# Patient Record
Sex: Female | Born: 1995
Health system: Southern US, Community
[De-identification: ages and names within clinical notes are randomized; demographics above are authoritative.]

## PROBLEM LIST (undated history)

## (undated) ENCOUNTER — Inpatient Hospital Stay: Payer: Self-pay

## (undated) DIAGNOSIS — R51 Headache: Secondary | ICD-10-CM

## (undated) DIAGNOSIS — J45909 Unspecified asthma, uncomplicated: Secondary | ICD-10-CM

## (undated) DIAGNOSIS — O99012 Anemia complicating pregnancy, second trimester: Secondary | ICD-10-CM

## (undated) DIAGNOSIS — R519 Headache, unspecified: Secondary | ICD-10-CM

## (undated) HISTORY — PX: NO PAST SURGERIES: SHX2092

---
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
## 2013-09-11 ENCOUNTER — Emergency Department: Payer: Self-pay | Admitting: Emergency Medicine

## 2013-12-28 ENCOUNTER — Emergency Department: Payer: Self-pay | Admitting: Emergency Medicine

## 2014-01-22 ENCOUNTER — Emergency Department: Payer: Self-pay | Admitting: Emergency Medicine

## 2014-01-22 LAB — COMPREHENSIVE METABOLIC PANEL
ALK PHOS: 79 U/L
ALT: 18 U/L (ref 12–78)
Albumin: 3.8 g/dL (ref 3.8–5.6)
Anion Gap: 8 (ref 7–16)
BUN: 8 mg/dL (ref 7–18)
Bilirubin,Total: 0.5 mg/dL (ref 0.2–1.0)
CALCIUM: 9 mg/dL (ref 9.0–10.7)
CREATININE: 0.8 mg/dL (ref 0.60–1.30)
Chloride: 108 mmol/L — ABNORMAL HIGH (ref 98–107)
Co2: 21 mmol/L (ref 21–32)
EGFR (African American): >60
EGFR (Non-African Amer.): >60
GLUCOSE: 82 mg/dL (ref 65–99)
Osmolality: 271 (ref 275–301)
Potassium: 3.4 mmol/L — ABNORMAL LOW (ref 3.5–5.1)
SGOT(AST): 26 U/L (ref 0–26)
Sodium: 137 mmol/L (ref 136–145)
Total Protein: 7.7 g/dL (ref 6.4–8.6)

## 2014-01-22 LAB — CBC WITH DIFFERENTIAL/PLATELET
Basophil #: 0.1 10*3/uL (ref 0.0–0.1)
Basophil %: 0.7 %
EOS PCT: 0.9 %
Eosinophil #: 0.1 10*3/uL (ref 0.0–0.7)
HCT: 39.5 % (ref 35.0–47.0)
HGB: 12.7 g/dL (ref 12.0–16.0)
LYMPHS PCT: 47.6 %
Lymphocyte #: 3.7 10*3/uL — ABNORMAL HIGH (ref 1.0–3.6)
MCH: 29.3 pg (ref 26.0–34.0)
MCHC: 32.1 g/dL (ref 32.0–36.0)
MCV: 91 fL (ref 80–100)
MONO ABS: 0.5 x10 3/mm (ref 0.2–0.9)
Monocyte %: 6.9 %
NEUTROS ABS: 3.4 10*3/uL (ref 1.4–6.5)
Neutrophil %: 43.9 %
Platelet: 244 10*3/uL (ref 150–440)
RBC: 4.32 10*6/uL (ref 3.80–5.20)
RDW: 14.4 % (ref 11.5–14.5)
WBC: 7.8 10*3/uL (ref 3.6–11.0)

## 2014-01-22 LAB — URINALYSIS, COMPLETE
BLOOD: NEGATIVE
Bacteria: NONE SEEN
Bilirubin,UR: NEGATIVE
Glucose,UR: NEGATIVE mg/dL (ref 0–75)
NITRITE: NEGATIVE
Ph: 5 (ref 4.5–8.0)
Protein: 30
Specific Gravity: 1.033 (ref 1.003–1.030)
WBC UR: 80 /HPF (ref 0–5)

## 2014-01-22 LAB — TROPONIN I

## 2014-01-22 LAB — LIPASE, BLOOD: LIPASE: 144 U/L (ref 73–393)

## 2014-02-03 ENCOUNTER — Emergency Department: Payer: Self-pay | Admitting: Emergency Medicine

## 2014-02-03 LAB — URINALYSIS, COMPLETE
Bilirubin,UR: NEGATIVE
Glucose,UR: NEGATIVE mg/dL (ref 0–75)
KETONE: NEGATIVE
Nitrite: NEGATIVE
PH: 7 (ref 4.5–8.0)
Protein: NEGATIVE
RBC,UR: 6 /HPF (ref 0–5)
SPECIFIC GRAVITY: 1.006 (ref 1.003–1.030)
Squamous Epithelial: 9
WBC UR: 28 /HPF (ref 0–5)

## 2014-02-04 LAB — URINE CULTURE

## 2014-03-01 ENCOUNTER — Emergency Department: Payer: Self-pay | Admitting: Emergency Medicine

## 2014-03-01 LAB — COMPREHENSIVE METABOLIC PANEL
ALBUMIN: 4 g/dL (ref 3.8–5.6)
Alkaline Phosphatase: 90 U/L
Anion Gap: 13 (ref 7–16)
BUN: 12 mg/dL (ref 7–18)
Bilirubin,Total: 0.5 mg/dL (ref 0.2–1.0)
CHLORIDE: 108 mmol/L — AB (ref 98–107)
Calcium, Total: 9.2 mg/dL (ref 9.0–10.7)
Co2: 21 mmol/L (ref 21–32)
Creatinine: 0.99 mg/dL (ref 0.60–1.30)
EGFR (Non-African Amer.): >60
GLUCOSE: 121 mg/dL — AB (ref 65–99)
Osmolality: 284 (ref 275–301)
POTASSIUM: 3.5 mmol/L (ref 3.5–5.1)
SGOT(AST): 21 U/L (ref 0–26)
SGPT (ALT): 15 U/L
SODIUM: 142 mmol/L (ref 136–145)
TOTAL PROTEIN: 8.2 g/dL (ref 6.4–8.6)

## 2014-03-01 LAB — URINALYSIS, COMPLETE
BACTERIA: NONE SEEN
Bilirubin,UR: NEGATIVE
Glucose,UR: NEGATIVE mg/dL (ref 0–75)
Leukocyte Esterase: NEGATIVE
NITRITE: NEGATIVE
Ph: 5 (ref 4.5–8.0)
SQUAMOUS EPITHELIAL: NONE SEEN
Specific Gravity: 1.034 (ref 1.003–1.030)

## 2014-03-01 LAB — CBC WITH DIFFERENTIAL/PLATELET
BASOS PCT: 0.3 %
Basophil #: 0 10*3/uL (ref 0.0–0.1)
Eosinophil #: 0 10*3/uL (ref 0.0–0.7)
Eosinophil %: 0.1 %
HCT: 42.5 % (ref 35.0–47.0)
HGB: 13.7 g/dL (ref 12.0–16.0)
LYMPHS PCT: 9.9 %
Lymphocyte #: 0.8 10*3/uL — ABNORMAL LOW (ref 1.0–3.6)
MCH: 30 pg (ref 26.0–34.0)
MCHC: 32.2 g/dL (ref 32.0–36.0)
MCV: 93 fL (ref 80–100)
MONOS PCT: 2.9 %
Monocyte #: 0.2 x10 3/mm (ref 0.2–0.9)
NEUTROS PCT: 86.8 %
Neutrophil #: 6.7 10*3/uL — ABNORMAL HIGH (ref 1.4–6.5)
Platelet: 262 10*3/uL (ref 150–440)
RBC: 4.57 10*6/uL (ref 3.80–5.20)
RDW: 14.3 % (ref 11.5–14.5)
WBC: 7.7 10*3/uL (ref 3.6–11.0)

## 2014-03-01 LAB — LIPASE, BLOOD: LIPASE: 116 U/L (ref 73–393)

## 2014-07-04 ENCOUNTER — Emergency Department: Payer: Self-pay | Admitting: Emergency Medicine

## 2014-07-04 LAB — COMPREHENSIVE METABOLIC PANEL
ALK PHOS: 88 U/L
ALT: 25 U/L
Albumin: 4 g/dL (ref 3.4–5.0)
Anion Gap: 7 (ref 7–16)
BILIRUBIN TOTAL: 0.5 mg/dL (ref 0.2–1.0)
BUN: 8 mg/dL (ref 7–18)
CHLORIDE: 108 mmol/L — AB (ref 98–107)
CREATININE: 0.81 mg/dL (ref 0.60–1.30)
Calcium, Total: 9.5 mg/dL (ref 8.5–10.1)
Co2: 25 mmol/L (ref 21–32)
EGFR (African American): >60
EGFR (Non-African Amer.): >60
GLUCOSE: 118 mg/dL — AB (ref 65–99)
Osmolality: 279 (ref 275–301)
POTASSIUM: 3.8 mmol/L (ref 3.5–5.1)
SGOT(AST): 26 U/L (ref 15–37)
Sodium: 140 mmol/L (ref 136–145)
TOTAL PROTEIN: 7.9 g/dL (ref 6.4–8.2)

## 2014-07-04 LAB — CBC WITH DIFFERENTIAL/PLATELET
BASOS ABS: 0 10*3/uL (ref 0.0–0.1)
Basophil %: 0.4 %
EOS PCT: 1.3 %
Eosinophil #: 0.1 10*3/uL (ref 0.0–0.7)
HCT: 41.3 % (ref 35.0–47.0)
HGB: 13.4 g/dL (ref 12.0–16.0)
LYMPHS PCT: 13 %
Lymphocyte #: 1.1 10*3/uL (ref 1.0–3.6)
MCH: 30.1 pg (ref 26.0–34.0)
MCHC: 32.5 g/dL (ref 32.0–36.0)
MCV: 93 fL (ref 80–100)
MONOS PCT: 2.7 %
Monocyte #: 0.2 x10 3/mm (ref 0.2–0.9)
NEUTROS ABS: 7.2 10*3/uL — AB (ref 1.4–6.5)
Neutrophil %: 82.6 %
Platelet: 275 10*3/uL (ref 150–440)
RBC: 4.45 10*6/uL (ref 3.80–5.20)
RDW: 14 % (ref 11.5–14.5)
WBC: 8.8 10*3/uL (ref 3.6–11.0)

## 2014-07-04 LAB — URINALYSIS, COMPLETE
BILIRUBIN, UR: NEGATIVE
GLUCOSE, UR: NEGATIVE mg/dL (ref 0–75)
NITRITE: NEGATIVE
Ph: 7 (ref 4.5–8.0)
Specific Gravity: 1.021 (ref 1.003–1.030)
Squamous Epithelial: 12
WBC UR: 46 /HPF (ref 0–5)

## 2014-07-04 LAB — LIPASE, BLOOD: Lipase: 109 U/L (ref 73–393)

## 2014-07-04 LAB — HCG, QUANTITATIVE, PREGNANCY

## 2014-07-07 LAB — URINE CULTURE

## 2014-08-28 ENCOUNTER — Emergency Department: Payer: Self-pay | Admitting: Emergency Medicine

## 2014-10-02 ENCOUNTER — Emergency Department: Payer: Self-pay | Admitting: Emergency Medicine

## 2014-10-02 LAB — COMPREHENSIVE METABOLIC PANEL
ALK PHOS: 71 U/L
ALT: 13 U/L — AB
AST: 19 U/L
Albumin: 4.7 g/dL
Anion Gap: 8 (ref 7–16)
BUN: 12 mg/dL
Bilirubin,Total: 0.3 mg/dL
Calcium, Total: 9.6 mg/dL
Chloride: 108 mmol/L
Co2: 24 mmol/L
Creatinine: 0.69 mg/dL
EGFR (African American): >60
Glucose: 142 mg/dL — ABNORMAL HIGH
POTASSIUM: 3.9 mmol/L
SODIUM: 140 mmol/L
Total Protein: 7.7 g/dL

## 2014-10-02 LAB — CBC WITH DIFFERENTIAL/PLATELET
BASOS ABS: 0.1 10*3/uL (ref 0.0–0.1)
Basophil %: 0.6 %
EOS PCT: 0.1 %
Eosinophil #: 0 10*3/uL (ref 0.0–0.7)
HCT: 39 % (ref 35.0–47.0)
HGB: 13 g/dL (ref 12.0–16.0)
LYMPHS ABS: 0.8 10*3/uL — AB (ref 1.0–3.6)
Lymphocyte %: 8.2 %
MCH: 30.3 pg (ref 26.0–34.0)
MCHC: 33.2 g/dL (ref 32.0–36.0)
MCV: 91 fL (ref 80–100)
Monocyte #: 0.3 x10 3/mm (ref 0.2–0.9)
Monocyte %: 2.8 %
Neutrophil #: 9 10*3/uL — ABNORMAL HIGH (ref 1.4–6.5)
Neutrophil %: 88.3 %
Platelet: 250 10*3/uL (ref 150–440)
RBC: 4.28 10*6/uL (ref 3.80–5.20)
RDW: 13.9 % (ref 11.5–14.5)
WBC: 10.1 10*3/uL (ref 3.6–11.0)

## 2014-10-02 LAB — LIPASE, BLOOD: Lipase: 49 U/L

## 2014-10-02 LAB — HCG, QUANTITATIVE, PREGNANCY: Beta Hcg, Quant.: <1 m[IU]/mL

## 2014-11-01 ENCOUNTER — Emergency Department: Admit: 2014-11-01 | Discharge status: Against Medical Advice | Payer: Self-pay | Admitting: Emergency Medicine

## 2014-11-01 LAB — COMPREHENSIVE METABOLIC PANEL
ALBUMIN: 4.6 g/dL
ALK PHOS: 67 U/L
ANION GAP: 10 (ref 7–16)
BUN: 9 mg/dL
Bilirubin,Total: 0.4 mg/dL
CALCIUM: 9.5 mg/dL
Chloride: 106 mmol/L
Co2: 23 mmol/L
Creatinine: 0.72 mg/dL
EGFR (African American): >60
EGFR (Non-African Amer.): >60
Glucose: 125 mg/dL — ABNORMAL HIGH
Potassium: 3.8 mmol/L
SGOT(AST): 23 U/L
SGPT (ALT): 15 U/L
SODIUM: 139 mmol/L
Total Protein: 7.8 g/dL

## 2014-11-01 LAB — CBC WITH DIFFERENTIAL/PLATELET
BASOS ABS: 0 10*3/uL (ref 0.0–0.1)
Basophil %: 0.3 %
Eosinophil #: 0 10*3/uL (ref 0.0–0.7)
Eosinophil %: 0 %
HCT: 39.2 % (ref 35.0–47.0)
HGB: 13.1 g/dL (ref 12.0–16.0)
Lymphocyte #: 0.5 10*3/uL — ABNORMAL LOW (ref 1.0–3.6)
Lymphocyte %: 5.6 %
MCH: 30.5 pg (ref 26.0–34.0)
MCHC: 33.3 g/dL (ref 32.0–36.0)
MCV: 92 fL (ref 80–100)
Monocyte #: 0.2 x10 3/mm (ref 0.2–0.9)
Monocyte %: 1.9 %
NEUTROS ABS: 8.9 10*3/uL — AB (ref 1.4–6.5)
NEUTROS PCT: 92.2 %
Platelet: 231 10*3/uL (ref 150–440)
RBC: 4.28 10*6/uL (ref 3.80–5.20)
RDW: 14 % (ref 11.5–14.5)
WBC: 9.7 10*3/uL (ref 3.6–11.0)

## 2014-12-01 ENCOUNTER — Other Ambulatory Visit: Payer: Self-pay

## 2014-12-01 ENCOUNTER — Emergency Department: Payer: Medicaid Other

## 2014-12-01 ENCOUNTER — Encounter: Payer: Self-pay | Admitting: Emergency Medicine

## 2014-12-01 ENCOUNTER — Emergency Department
Admission: EM | Admit: 2014-12-01 | Discharge: 2014-12-01 | Disposition: A | Discharge status: Home / Self Care | Payer: Medicaid Other | Attending: Emergency Medicine | Admitting: Emergency Medicine

## 2014-12-01 DIAGNOSIS — Z3202 Encounter for pregnancy test, result negative: Secondary | ICD-10-CM | POA: Insufficient documentation

## 2014-12-01 DIAGNOSIS — N946 Dysmenorrhea, unspecified: Secondary | ICD-10-CM | POA: Insufficient documentation

## 2014-12-01 DIAGNOSIS — R112 Nausea with vomiting, unspecified: Secondary | ICD-10-CM

## 2014-12-01 DIAGNOSIS — Z79899 Other long term (current) drug therapy: Secondary | ICD-10-CM | POA: Diagnosis not present

## 2014-12-01 DIAGNOSIS — E876 Hypokalemia: Secondary | ICD-10-CM | POA: Insufficient documentation

## 2014-12-01 DIAGNOSIS — N3000 Acute cystitis without hematuria: Secondary | ICD-10-CM | POA: Diagnosis not present

## 2014-12-01 DIAGNOSIS — K922 Gastrointestinal hemorrhage, unspecified: Secondary | ICD-10-CM | POA: Diagnosis not present

## 2014-12-01 DIAGNOSIS — R079 Chest pain, unspecified: Secondary | ICD-10-CM | POA: Diagnosis present

## 2014-12-01 DIAGNOSIS — J4521 Mild intermittent asthma with (acute) exacerbation: Secondary | ICD-10-CM | POA: Insufficient documentation

## 2014-12-01 HISTORY — DX: Unspecified asthma, uncomplicated: J45.909

## 2014-12-01 LAB — URINALYSIS COMPLETE WITH MICROSCOPIC (ARMC ONLY)
Bilirubin Urine: NEGATIVE
Glucose, UA: NEGATIVE mg/dL
NITRITE: NEGATIVE
Protein, ur: NEGATIVE mg/dL
SPECIFIC GRAVITY, URINE: 1.005 (ref 1.005–1.030)
pH: 6 (ref 5.0–8.0)

## 2014-12-01 LAB — CBC WITH DIFFERENTIAL/PLATELET
Basophils Absolute: 0 10*3/uL (ref 0–0.1)
Basophils Relative: 1 %
Eosinophils Absolute: 0 10*3/uL (ref 0–0.7)
Eosinophils Relative: 0 %
HCT: 39.4 % (ref 35.0–47.0)
HEMOGLOBIN: 13.3 g/dL (ref 12.0–16.0)
LYMPHS ABS: 2.5 10*3/uL (ref 1.0–3.6)
LYMPHS PCT: 31 %
MCH: 30.7 pg (ref 26.0–34.0)
MCHC: 33.8 g/dL (ref 32.0–36.0)
MCV: 90.6 fL (ref 80.0–100.0)
Monocytes Absolute: 0.6 10*3/uL (ref 0.2–0.9)
Monocytes Relative: 7 %
Neutro Abs: 4.9 10*3/uL (ref 1.4–6.5)
Neutrophils Relative %: 61 %
Platelets: 248 10*3/uL (ref 150–440)
RBC: 4.34 MIL/uL (ref 3.80–5.20)
RDW: 13.8 % (ref 11.5–14.5)
WBC: 8 10*3/uL (ref 3.6–11.0)

## 2014-12-01 LAB — BASIC METABOLIC PANEL
ANION GAP: 13 (ref 5–15)
BUN: 12 mg/dL (ref 6–20)
CO2: 24 mmol/L (ref 22–32)
Calcium: 9.4 mg/dL (ref 8.9–10.3)
Chloride: 99 mmol/L — ABNORMAL LOW (ref 101–111)
Creatinine, Ser: 0.86 mg/dL (ref 0.44–1.00)
GFR calc non Af Amer: >60 mL/min (ref >60)
Glucose, Bld: 80 mg/dL (ref 65–99)
Potassium: 2.8 mmol/L — CL (ref 3.5–5.1)
SODIUM: 136 mmol/L (ref 135–145)

## 2014-12-01 LAB — LIPASE, BLOOD: Lipase: 36 U/L (ref 22–51)

## 2014-12-01 LAB — MAGNESIUM: MAGNESIUM: 2.2 mg/dL (ref 1.7–2.4)

## 2014-12-01 LAB — PHOSPHORUS: Phosphorus: 2.3 mg/dL — ABNORMAL LOW (ref 2.5–4.6)

## 2014-12-01 LAB — PREGNANCY, URINE: Preg Test, Ur: NEGATIVE

## 2014-12-01 MED ORDER — POTASSIUM CHLORIDE CRYS ER 20 MEQ PO TBCR
20.0000 meq | EXTENDED_RELEASE_TABLET | Freq: Once | ORAL | Status: AC
  Administered 2014-12-01: 20 meq via ORAL

## 2014-12-01 MED ORDER — KETOROLAC TROMETHAMINE 30 MG/ML IJ SOLN
INTRAMUSCULAR | Status: AC
  Administered 2014-12-01: 30 mg via INTRAVENOUS
  Filled 2014-12-01: qty 1

## 2014-12-01 MED ORDER — POTASSIUM CHLORIDE ER 10 MEQ PO TBCR: 10.0000 meq | EXTENDED_RELEASE_TABLET | Freq: Every day | ORAL | Status: DC

## 2014-12-01 MED ORDER — ONDANSETRON HCL 4 MG/2ML IJ SOLN
INTRAMUSCULAR | Status: AC
Start: 2014-12-01 — End: 2014-12-01
  Administered 2014-12-01: 4 mg via INTRAVENOUS
  Filled 2014-12-01: qty 2

## 2014-12-01 MED ORDER — IBUPROFEN 800 MG PO TABS: 800.0000 mg | ORAL_TABLET | Freq: Three times a day (TID) | ORAL | Status: DC | PRN

## 2014-12-01 MED ORDER — ONDANSETRON HCL 4 MG/2ML IJ SOLN
4.0000 mg | Freq: Once | INTRAMUSCULAR | Status: AC
  Administered 2014-12-01: 4 mg via INTRAVENOUS

## 2014-12-01 MED ORDER — IPRATROPIUM-ALBUTEROL 0.5-2.5 (3) MG/3ML IN SOLN
RESPIRATORY_TRACT | Status: AC
  Administered 2014-12-01: 3 mL via RESPIRATORY_TRACT
  Filled 2014-12-01: qty 3

## 2014-12-01 MED ORDER — IPRATROPIUM-ALBUTEROL 0.5-2.5 (3) MG/3ML IN SOLN
3.0000 mL | Freq: Once | RESPIRATORY_TRACT | Status: AC
  Administered 2014-12-01: 3 mL via RESPIRATORY_TRACT

## 2014-12-01 MED ORDER — DIPHENHYDRAMINE HCL 50 MG/ML IJ SOLN
25.0000 mg | Freq: Once | INTRAMUSCULAR | Status: AC
  Administered 2014-12-01: 25 mg via INTRAVENOUS

## 2014-12-01 MED ORDER — POTASSIUM CHLORIDE CRYS ER 20 MEQ PO TBCR
EXTENDED_RELEASE_TABLET | ORAL | Status: AC
  Filled 2014-12-01: qty 1

## 2014-12-01 MED ORDER — MORPHINE SULFATE 2 MG/ML IJ SOLN
INTRAMUSCULAR | Status: AC
  Filled 2014-12-01: qty 1

## 2014-12-01 MED ORDER — DIPHENHYDRAMINE HCL 50 MG/ML IJ SOLN
INTRAMUSCULAR | Status: AC
  Administered 2014-12-01: 25 mg via INTRAVENOUS
  Filled 2014-12-01: qty 1

## 2014-12-01 MED ORDER — POTASSIUM CHLORIDE 10 MEQ/100ML IV SOLN
10.0000 meq | Freq: Once | INTRAVENOUS | Status: AC
Start: 2014-12-01 — End: 2014-12-01
  Administered 2014-12-01: 10 meq via INTRAVENOUS
  Filled 2014-12-01: qty 100

## 2014-12-01 MED ORDER — METOCLOPRAMIDE HCL 5 MG/ML IJ SOLN
10.0000 mg | Freq: Once | INTRAMUSCULAR | Status: AC
  Administered 2014-12-01: 10 mg via INTRAVENOUS

## 2014-12-01 MED ORDER — METOCLOPRAMIDE HCL 5 MG/ML IJ SOLN
INTRAMUSCULAR | Status: AC
  Administered 2014-12-01: 10 mg via INTRAVENOUS
  Filled 2014-12-01: qty 2

## 2014-12-01 MED ORDER — SULFAMETHOXAZOLE-TRIMETHOPRIM 800-160 MG PO TABS: 1.0000 | ORAL_TABLET | Freq: Two times a day (BID) | ORAL | Status: DC

## 2014-12-01 MED ORDER — SODIUM CHLORIDE 0.9 % IV SOLN
Freq: Once | INTRAVENOUS | Status: AC
  Administered 2014-12-01: 19:00:00 via INTRAVENOUS

## 2014-12-01 MED ORDER — DEXAMETHASONE SODIUM PHOSPHATE 10 MG/ML IJ SOLN
INTRAMUSCULAR | Status: AC
Start: 2014-12-01 — End: 2014-12-01
  Administered 2014-12-01: 10 mg via INTRAVENOUS
  Filled 2014-12-01: qty 1

## 2014-12-01 MED ORDER — TRAMADOL HCL 50 MG PO TABS: 50.0000 mg | ORAL_TABLET | Freq: Four times a day (QID) | ORAL | Status: DC | PRN

## 2014-12-01 MED ORDER — ONDANSETRON 4 MG PO TBDP: 4.0000 mg | ORAL_TABLET | Freq: Three times a day (TID) | ORAL | Status: DC | PRN

## 2014-12-01 MED ORDER — PROMETHAZINE HCL 12.5 MG PO TABS: 12.5000 mg | ORAL_TABLET | Freq: Four times a day (QID) | ORAL | Status: DC | PRN

## 2014-12-01 MED ORDER — KETOROLAC TROMETHAMINE 30 MG/ML IJ SOLN
30.0000 mg | Freq: Once | INTRAMUSCULAR | Status: AC
  Administered 2014-12-01: 30 mg via INTRAVENOUS

## 2014-12-01 MED ORDER — DEXAMETHASONE SODIUM PHOSPHATE 10 MG/ML IJ SOLN
10.0000 mg | Freq: Once | INTRAMUSCULAR | Status: AC
  Administered 2014-12-01: 10 mg via INTRAVENOUS

## 2014-12-01 MED ORDER — SODIUM CHLORIDE 0.9 % IV BOLUS (SEPSIS)
1000.0000 mL | Freq: Once | INTRAVENOUS | Status: AC
  Administered 2014-12-01: 1000 mL via INTRAVENOUS

## 2014-12-01 MED ORDER — MORPHINE SULFATE 2 MG/ML IJ SOLN
2.0000 mg | Freq: Once | INTRAMUSCULAR | Status: AC
  Administered 2014-12-01: 2 mg via INTRAVENOUS

## 2014-12-01 NOTE — ED Notes (Signed)
Patient appears calmer now and in no distress sitting in lobby.

## 2014-12-01 NOTE — ED Provider Notes (Signed)
ED ECG REPORT I, Jenney Brester W, the attending physician, personally viewed and interpreted this ECG.   Date: 12/01/2014  EKG Time: 1729  Rate: 56  Rhythm: sinus bradycardia rhythm  Axis: Normal  Intervals: Normal  ST&T Change: None   Darien Ramusavid W Klayten Jolliff, MD 12/01/14 1743

## 2014-12-01 NOTE — ED Notes (Addendum)
Patient c/o decreased appetite x2 days with nausea. Also reports chest pain and shortness of breath. Patient states does not feel like normal asthma. Patient tachypneic and appears anxious. Mother denies history of anxiety.

## 2014-12-01 NOTE — ED Provider Notes (Signed)
CSN: [id number]     Arrival date & time 12/01/14  1330 History   First MD Initiated Contact with Patient 12/01/14 1524     Chief Complaint  Patient presents with  . Chest Pain     (Consider location/radiation/quality/duration/timing/severity/associated sxs/prior Treatment) HPI  Report and worsening vomiting wheezing over the last 3 days now is having body aches and chest pain states that she is currently on her. the last few months with her. She's been getting sicker and sicker every time with nausea vomiting Zofran that she had at home did not help but she is here today for further evaluation rates her overall pain and discomfort is a 10 out of 10 she denies fevers chills notes some increase in urination denies any bowel complaints skin rash or disease and otherwise no complaints at this time Past Medical History  Diagnosis Date  . Asthma    History reviewed. No pertinent past surgical history. No family history on file. History  Substance Use Topics  . Smoking status: Never Smoker   . Smokeless tobacco: Not on file  . Alcohol Use: No   OB History    No data available     Review of Systems  Constitutional: Negative.   HENT: Negative.   Eyes: Negative.   Respiratory: Positive for wheezing.   Cardiovascular: Positive for chest pain.  Gastrointestinal: Positive for nausea and vomiting.  Endocrine: Negative.   Genitourinary: Positive for frequency.  Musculoskeletal: Positive for myalgias.  Skin: Negative.   Neurological: Negative.   Hematological: Negative.   Psychiatric/Behavioral: Negative.   All other systems reviewed and are negative.     Allergies  Review of patient's allergies indicates no known allergies.  Home Medications   Prior to Admission medications   Medication Sig Start Date End Date Taking? Authorizing Provider  albuterol (PROVENTIL HFA;VENTOLIN HFA) 108 (90 BASE) MCG/ACT inhaler Inhale 2 puffs into the lungs every 6 (six) hours as needed for  wheezing or shortness of breath.   Yes Historical Provider, MD  ibuprofen (ADVIL,MOTRIN) 800 MG tablet Take 1 tablet (800 mg total) by mouth every 8 (eight) hours as needed for fever, mild pain, moderate pain or cramping. 12/01/14   Kartel Wolbert Kristine GarbeWilliam C Ordell Prichett, PA-C  ondansetron (ZOFRAN ODT) 4 MG disintegrating tablet Take 1 tablet (4 mg total) by mouth every 8 (eight) hours as needed for nausea or vomiting (take 15 minutes before eating). 12/01/14   Elmo Shumard William C Alexandr Yaworski, PA-C  potassium chloride (K-DUR) 10 MEQ tablet Take 1 tablet (10 mEq total) by mouth daily. 12/01/14   Chasya Zenz Kristine GarbeWilliam C Prem Coykendall, PA-C  promethazine (PHENERGAN) 12.5 MG tablet Take 1 tablet (12.5 mg total) by mouth every 6 (six) hours as needed for nausea or vomiting. 12/01/14   Lorenna Lurry Rosalyn GessWilliam C Eliodoro Gullett, PA-C  sulfamethoxazole-trimethoprim (BACTRIM DS,SEPTRA DS) 800-160 MG per tablet Take 1 tablet by mouth 2 (two) times daily. 12/01/14   Moua Rasmusson Kristine GarbeWilliam C Hurley Blevins, PA-C  traMADol (ULTRAM) 50 MG tablet Take 1 tablet (50 mg total) by mouth every 6 (six) hours as needed. 12/01/14   Leocadia Idleman William C Varonica Siharath, PA-C   BP 112/66 mmHg  Pulse 60  Temp(Src) 98.2 F (36.8 C) (Oral)  Resp 18  Ht 5\' 1"  (1.549 m)  Wt 175 lb (79.379 kg)  BMI 33.08 kg/m2  SpO2 100%  LMP 11/28/2014 (Exact Date) Physical Exam Female appearing stated age well-developed well-nourished in mild distress lying uncomfortably in bed Vitals reviewed Head ears eyes nose neck and throat exam was grossly  unremarkable Cardiovascular patient had a regular rate and rhythm no murmurs rubs gallops Pulmonary patient was wheezing throughout all fields bilaterally Abdomen was soft flat mildly tender to palpation throughout all quadrants bowel sounds positive in all 4 quadrants Skin was free of rash or disease Extremities patient was moving without difficulty had no pedal edema musculoskeletal exam was otherwise unremarkable except for some tenderness with palpation across the patient's upper  chest Neuro exam nonfocal cranial nerves II through XII grossly intact Psychologically patient is acting very appropriate and pleasant ED Course  Procedures (including critical care time) Labs Review Labs Reviewed  URINALYSIS COMPLETEWITH MICROSCOPIC ([facility] ONLY) - Abnormal; Notable for the following:    Color, Urine STRAW (*)    APPearance CLEAR (*)    Ketones, ur 1+ (*)    Hgb urine dipstick 1+ (*)    Leukocytes, UA 3+ (*)    Bacteria, UA RARE (*)    Squamous Epithelial / LPF 6-30 (*)    All other components within normal limits  BASIC METABOLIC PANEL - Abnormal; Notable for the following:    Potassium 2.8 (*)    Chloride 99 (*)    All other components within normal limits  PHOSPHORUS - Abnormal; Notable for the following:    Phosphorus 2.3 (*)    All other components within normal limits  CBC WITH DIFFERENTIAL/PLATELET  PREGNANCY, URINE  LIPASE, BLOOD  MAGNESIUM    Imaging Review Dg Chest 2 View  12/01/2014   CLINICAL DATA:  Three day history of vomiting.  EXAM: CHEST  2 VIEW  COMPARISON:  01/23/2014  FINDINGS: The heart size and mediastinal contours are within normal limits. Both lungs are clear. The visualized skeletal structures are unremarkable.  IMPRESSION: Normal chest x-ray.   Electronically Signed   By: Rudie Meyer M.D.   On: 12/01/2014 15:07   EKG noted by attending physician  MDM  Patient is reporting worsening dysmenorrhea over the last 6 months every tablet. She has vomiting was not controlled with Zofran at home states that now she is having muscle aches chest pain she's wheezing and having urinary frequency labs showed her to be hypokalemic of an early urinary tract infection after a couple of nebs she is breathing much better given her IV and oral potassium her vomiting is under control she is passed by mouth trials and after fluids and medicines feels comfortable going home we'll discharge her to follow-up with her primary care provider early next week to come  up with a plan particular for the management of her dysmenorrhea she is to take all of her potassium supplements return here for any acute concerns or worsening symptoms and also taken a biotics for urinary tract infection Final diagnoses:  Hypokalemia, gastrointestinal losses  Dysmenorrhea  Non-intractable vomiting with nausea, vomiting of unspecified type  Asthma, mild intermittent, with acute exacerbation  Acute cystitis without hematuria       Jameila Keeny Rosalyn Gess, PA-C 12/01/14 2041  Sharman Cheek, MD 12/02/14 [phone]

## 2014-12-01 NOTE — ED Notes (Signed)
PA notified of Potassium 2.8

## 2014-12-01 NOTE — Discharge Instructions (Signed)

## 2014-12-08 ENCOUNTER — Emergency Department: Payer: Medicaid Other

## 2014-12-08 ENCOUNTER — Encounter: Payer: Self-pay | Admitting: Emergency Medicine

## 2014-12-08 ENCOUNTER — Other Ambulatory Visit: Payer: Self-pay

## 2014-12-08 ENCOUNTER — Emergency Department
Admission: EM | Admit: 2014-12-08 | Discharge: 2014-12-09 | Disposition: A | Discharge status: Home / Self Care | Payer: Medicaid Other | Attending: Emergency Medicine | Admitting: Emergency Medicine

## 2014-12-08 DIAGNOSIS — Z792 Long term (current) use of antibiotics: Secondary | ICD-10-CM | POA: Diagnosis not present

## 2014-12-08 DIAGNOSIS — Z79899 Other long term (current) drug therapy: Secondary | ICD-10-CM | POA: Insufficient documentation

## 2014-12-08 DIAGNOSIS — R064 Hyperventilation: Secondary | ICD-10-CM | POA: Diagnosis not present

## 2014-12-08 DIAGNOSIS — R079 Chest pain, unspecified: Secondary | ICD-10-CM

## 2014-12-08 DIAGNOSIS — J45901 Unspecified asthma with (acute) exacerbation: Secondary | ICD-10-CM | POA: Insufficient documentation

## 2014-12-08 DIAGNOSIS — R200 Anesthesia of skin: Secondary | ICD-10-CM | POA: Insufficient documentation

## 2014-12-08 DIAGNOSIS — Z720 Tobacco use: Secondary | ICD-10-CM | POA: Insufficient documentation

## 2014-12-08 DIAGNOSIS — J45909 Unspecified asthma, uncomplicated: Secondary | ICD-10-CM

## 2014-12-08 LAB — CBC
HEMATOCRIT: 38.5 % (ref 35.0–47.0)
HEMOGLOBIN: 12.7 g/dL (ref 12.0–16.0)
MCH: 30.6 pg (ref 26.0–34.0)
MCHC: 33 g/dL (ref 32.0–36.0)
MCV: 92.9 fL (ref 80.0–100.0)
Platelets: 257 10*3/uL (ref 150–440)
RBC: 4.14 MIL/uL (ref 3.80–5.20)
RDW: 14.3 % (ref 11.5–14.5)
WBC: 8 10*3/uL (ref 3.6–11.0)

## 2014-12-08 LAB — BASIC METABOLIC PANEL
Anion gap: 8 (ref 5–15)
BUN: 6 mg/dL (ref 6–20)
CALCIUM: 9.3 mg/dL (ref 8.9–10.3)
CHLORIDE: 108 mmol/L (ref 101–111)
CO2: 24 mmol/L (ref 22–32)
CREATININE: 0.79 mg/dL (ref 0.44–1.00)
GFR calc non Af Amer: >60 mL/min (ref >60)
GLUCOSE: 99 mg/dL (ref 65–99)
Potassium: 3.5 mmol/L (ref 3.5–5.1)
Sodium: 140 mmol/L (ref 135–145)

## 2014-12-08 LAB — TROPONIN I: Troponin I: <0.03 ng/mL (ref <0.031)

## 2014-12-08 NOTE — ED Notes (Signed)
20G IV in left AC removed at d/c that was placed by EMS PTA. Catheter intact, no redness, swelling or bleeding noted.

## 2014-12-08 NOTE — ED Notes (Signed)
Pt states pain is a 10/10, but is calmly talking on her cell phone and avoiding direct eye contact during the entire initial assessment.

## 2014-12-08 NOTE — ED Notes (Signed)
Brown, MD at bedside. 

## 2014-12-08 NOTE — Discharge Instructions (Signed)
Hyperventilation °Hyperventilation is breathing that is deeper and more rapid than normal. It is usually associated with panic and anxiety. Hyperventilation can make you feel breathless. It is sometimes called overbreathing. Breathing out too much causes a decrease in the amount of carbon dioxide gas in the blood. This leads to tingling and numbness in the hands, feet, and around the mouth. If this continues, your fingers, hands, and toes may begin to spasm. Hyperventilation usually lasts 20-30 minutes and can be associated with other symptoms of panic and anxiety, including:  °· Chest pains or tightness. °· A pounding or irregular, racing heartbeat (palpitations). °· Dizziness. °· Lightheadedness. °· Dry mouth. °· Weakness. °· Confusion. °· Sleep disturbance. °CAUSES  °Sudden onset (acute) hyperventilation is usually triggered by acute stress, anxiety, or emotional upset. Long-term (chronic) and recurring hyperventilation can occur with chronic lung problems, such emphysema or asthma. Other causes include:  °· Nervousness. °· Stress. °· Stimulant, drug, or alcohol use. °· Lung disease. °· Infections, such as pneumonia. °· Heart problems. °· Severe pain. °· Waking from a bad dream. °· Pregnancy. °· Bleeding. °HOME CARE INSTRUCTIONS °· Learn and use breathing exercises that help you breathe from your diaphragm and abdomen. °· Practice relaxation techniques to reduce stress, such as visualization, meditation, and muscle release. °· During an attack, try breathing into a paper bag. This changes the carbon dioxide level and slows down breathing. °SEEK IMMEDIATE MEDICAL CARE IF: °· Your hyperventilation continues or gets worse. °MAKE SURE YOU: °· Understand these instructions. °· Will watch your condition. °· Will get help right away if you are not doing well or get worse. °Document Released: 06/20/2000 Document Revised: 12/23/2011 Document Reviewed: 10/02/2011 °[employer]® Patient Information ©2015 [employer]. This  information is not intended to replace advice given to you by your health care provider. Make sure you discuss any questions you have with your health care provider. ° °

## 2014-12-08 NOTE — ED Provider Notes (Signed)
[facility] Emergency Department Provider Note  ____________________________________________  Time seen: 11:30 PM  I have reviewed the triage vital signs and the nursing notes.   HISTORY  Chief Complaint Asthma and Chest Pain      HPI Melinda Barnett is a 19 y.o. female presents with "asthma attack and chest pain". Patient states she was outside and started feeling numbness over her entire body which began in her hands and feet and then generalized everywhere followed by lightheadedness and tingling in the hands and feet. At present patient has no complaints all symptoms have completely resolved.     Past Medical History  Diagnosis Date  . Asthma     There are no active problems to display for this patient.   History reviewed. No pertinent past surgical history.  Current Outpatient Rx  Name  Route  Sig  Dispense  Refill  . albuterol (PROVENTIL HFA;VENTOLIN HFA) 108 (90 BASE) MCG/ACT inhaler   Inhalation   Inhale 2 puffs into the lungs every 6 (six) hours as needed for wheezing or shortness of breath.         Marland Kitchen ibuprofen (ADVIL,MOTRIN) 800 MG tablet   Oral   Take 1 tablet (800 mg total) by mouth every 8 (eight) hours as needed for fever, mild pain, moderate pain or cramping.   15 tablet   0   . ondansetron (ZOFRAN ODT) 4 MG disintegrating tablet   Oral   Take 1 tablet (4 mg total) by mouth every 8 (eight) hours as needed for nausea or vomiting (take 15 minutes before eating).   20 tablet   0   . potassium chloride (K-DUR) 10 MEQ tablet   Oral   Take 1 tablet (10 mEq total) by mouth daily.   5 tablet   0   . promethazine (PHENERGAN) 12.5 MG tablet   Oral   Take 1 tablet (12.5 mg total) by mouth every 6 (six) hours as needed for nausea or vomiting.   15 tablet   0   . sulfamethoxazole-trimethoprim (BACTRIM DS,SEPTRA DS) 800-160 MG per tablet   Oral   Take 1 tablet by mouth 2 (two) times daily.   10 tablet   0   . traMADol  (ULTRAM) 50 MG tablet   Oral   Take 1 tablet (50 mg total) by mouth every 6 (six) hours as needed.   12 tablet   0     Allergies Review of patient's allergies indicates no known allergies.  No family history on file.  Social History History  Substance Use Topics  . Smoking status: Current Every Day Smoker  . Smokeless tobacco: Not on file  . Alcohol Use: No    Review of Systems  Constitutional: Negative for fever. Eyes: Negative for visual changes. ENT: Negative for sore throat. Cardiovascular: Negative for chest pain. Respiratory: Positive for shortness of breath. Gastrointestinal: Negative for abdominal pain, vomiting and diarrhea. Genitourinary: Negative for dysuria. Musculoskeletal: Negative for back pain. Skin: Negative for rash. Neurological: Negative for headaches, focal weakness or numbness.   10-point ROS otherwise negative.  ____________________________________________   PHYSICAL EXAM:  VITAL SIGNS: ED Triage Vitals  Enc Vitals Group     BP 12/08/14 2030 99/52 mmHg     Pulse Rate 12/08/14 2254 71     Resp 12/08/14 2030 16     Temp 12/08/14 2030 98.6 F (37 C)     Temp Source 12/08/14 2030 Oral     SpO2 12/08/14 2030 99 %  Weight 12/08/14 2030 175 lb (79.379 kg)     Height 12/08/14 2030 5\' 1"  (1.549 m)     Head Cir --      Peak Flow --      Pain Score 12/08/14 2031 10     Pain Loc --      Pain Edu? --      Excl. in GC? --     Constitutional: Alert and oriented. Well appearing and in no distress. Eyes: Conjunctivae are normal. PERRL. Normal extraocular movements. ENT   Head: Normocephalic and atraumatic.   Nose: No congestion/rhinnorhea.   Mouth/Throat: Mucous membranes are moist.   Neck: No stridor. Hematological/Lymphatic/Immunilogical: No cervical lymphadenopathy. Cardiovascular: Normal rate, regular rhythm. Normal and symmetric distal pulses are present in all extremities. No murmurs, rubs, or gallops. Respiratory:  Normal respiratory effort without tachypnea nor retractions. Breath sounds are clear and equal bilaterally. No wheezes/rales/rhonchi. Gastrointestinal: Soft and nontender. No distention. There is no CVA tenderness. Genitourinary: deferred Musculoskeletal: Nontender with normal range of motion in all extremities. No joint effusions.  No lower extremity tenderness nor edema. Neurologic:  Normal speech and language. No gross focal neurologic deficits are appreciated. Speech is normal.  Skin:  Skin is warm, dry and intact. No rash noted. Psychiatric: Mood and affect are normal. Speech and behavior are normal. Patient exhibits appropriate insight and judgment.  ____________________________________________    LABS (pertinent positives/negatives)  Labs Reviewed  CBC  BASIC METABOLIC PANEL  TROPONIN I     ____________________________________________   EKG interpreted by me   Date: 12/08/2014  Rate:78  Rhythm: normal sinus rhythm  QRS Axis: normal  Intervals: normal  ST/T Wave abnormalities: normal  Conduction Disutrbances: none  Narrative Interpretation: unremarkable      ____________________________________________    RADIOLOGY  Chest x-ray revealed no acute process  ____________________________________________     INITIAL IMPRESSION / ASSESSMENT AND PLAN / ED COURSE  Pertinent labs & imaging results that were available during my care of the patient were reviewed by me and considered in my medical decision making (see chart for details).  History of physical exam and lab data consistent with possible hyperventilation. Family at the bedside stated that during the time of the event patient was breathing rapidly and appeared to not be to catch her breath.  ____________________________________________   FINAL CLINICAL IMPRESSION(S) / ED DIAGNOSES  Final diagnoses:  Chest pain  Asthma      Darci Currentandolph N Meghin Thivierge, MD 12/08/14 2353

## 2014-12-08 NOTE — ED Notes (Signed)
Pt presents to ED with c/o asthma attack and chest pain. Pt reports that she was outside of her home when she began to feel short of breath and lightheaded. Pt reports that symptoms have resolved now except for some residual chest tightness. Pt reports history of asthma and reports that this episode felt like an asthma attack. Pt is awake and alert, sitting upright in triage with no acute distress noted.

## 2015-01-25 ENCOUNTER — Emergency Department
Admission: EM | Admit: 2015-01-25 | Discharge: 2015-01-25 | Disposition: A | Discharge status: Home / Self Care | Payer: Medicaid Other | Attending: Emergency Medicine | Admitting: Emergency Medicine

## 2015-01-25 ENCOUNTER — Encounter: Payer: Self-pay | Admitting: Emergency Medicine

## 2015-01-25 DIAGNOSIS — O210 Mild hyperemesis gravidarum: Secondary | ICD-10-CM | POA: Diagnosis present

## 2015-01-25 DIAGNOSIS — F17210 Nicotine dependence, cigarettes, uncomplicated: Secondary | ICD-10-CM | POA: Insufficient documentation

## 2015-01-25 DIAGNOSIS — O99331 Smoking (tobacco) complicating pregnancy, first trimester: Secondary | ICD-10-CM | POA: Insufficient documentation

## 2015-01-25 DIAGNOSIS — Z3A01 Less than 8 weeks gestation of pregnancy: Secondary | ICD-10-CM | POA: Diagnosis not present

## 2015-01-25 DIAGNOSIS — O2311 Infections of bladder in pregnancy, first trimester: Secondary | ICD-10-CM | POA: Diagnosis not present

## 2015-01-25 DIAGNOSIS — Z79899 Other long term (current) drug therapy: Secondary | ICD-10-CM | POA: Diagnosis not present

## 2015-01-25 LAB — COMPREHENSIVE METABOLIC PANEL
ALT: 10 U/L — AB (ref 14–54)
ANION GAP: 9 (ref 5–15)
AST: 15 U/L (ref 15–41)
Albumin: 4.1 g/dL (ref 3.5–5.0)
Alkaline Phosphatase: 48 U/L (ref 38–126)
BILIRUBIN TOTAL: 0.3 mg/dL (ref 0.3–1.2)
BUN: 5 mg/dL — ABNORMAL LOW (ref 6–20)
CALCIUM: 9.5 mg/dL (ref 8.9–10.3)
CO2: 22 mmol/L (ref 22–32)
Chloride: 106 mmol/L (ref 101–111)
Creatinine, Ser: 0.62 mg/dL (ref 0.44–1.00)
GFR calc Af Amer: >60 mL/min (ref >60)
GFR calc non Af Amer: >60 mL/min (ref >60)
Glucose, Bld: 89 mg/dL (ref 65–99)
POTASSIUM: 3.7 mmol/L (ref 3.5–5.1)
SODIUM: 137 mmol/L (ref 135–145)
Total Protein: 7 g/dL (ref 6.5–8.1)

## 2015-01-25 LAB — CBC
HCT: 38.5 % (ref 35.0–47.0)
Hemoglobin: 12.8 g/dL (ref 12.0–16.0)
MCH: 30.3 pg (ref 26.0–34.0)
MCHC: 33.3 g/dL (ref 32.0–36.0)
MCV: 90.8 fL (ref 80.0–100.0)
PLATELETS: 240 10*3/uL (ref 150–440)
RBC: 4.24 MIL/uL (ref 3.80–5.20)
RDW: 13.7 % (ref 11.5–14.5)
WBC: 4.5 10*3/uL (ref 3.6–11.0)

## 2015-01-25 LAB — URINALYSIS COMPLETE WITH MICROSCOPIC (ARMC ONLY)
BILIRUBIN URINE: NEGATIVE
GLUCOSE, UA: NEGATIVE mg/dL
Hgb urine dipstick: NEGATIVE
Ketones, ur: NEGATIVE mg/dL
Nitrite: NEGATIVE
Protein, ur: 30 mg/dL — AB
SPECIFIC GRAVITY, URINE: 1.025 (ref 1.005–1.030)
pH: 5 (ref 5.0–8.0)

## 2015-01-25 LAB — HCG, QUANTITATIVE, PREGNANCY: hCG, Beta Chain, Quant, S: 243816 m[IU]/mL — ABNORMAL HIGH (ref <5)

## 2015-01-25 LAB — LIPASE, BLOOD: Lipase: 29 U/L (ref 22–51)

## 2015-01-25 MED ORDER — NITROFURANTOIN MACROCRYSTAL 100 MG PO CAPS: 100.0000 mg | ORAL_CAPSULE | Freq: Two times a day (BID) | ORAL | Status: DC

## 2015-01-25 MED ORDER — ONDANSETRON 8 MG PO TBDP
ORAL_TABLET | ORAL | Status: AC
  Administered 2015-01-25: 8 mg via ORAL
  Filled 2015-01-25: qty 1

## 2015-01-25 MED ORDER — ONDANSETRON 4 MG PO TBDP
8.0000 mg | ORAL_TABLET | Freq: Once | ORAL | Status: AC
  Administered 2015-01-25: 8 mg via ORAL

## 2015-01-25 MED ORDER — NITROFURANTOIN MACROCRYSTAL 100 MG PO CAPS
ORAL_CAPSULE | ORAL | Status: AC
  Filled 2015-01-25: qty 1

## 2015-01-25 MED ORDER — NITROFURANTOIN MONOHYD MACRO 100 MG PO CAPS
100.0000 mg | ORAL_CAPSULE | Freq: Once | ORAL | Status: AC
  Administered 2015-01-25: 100 mg via ORAL

## 2015-01-25 NOTE — ED Notes (Signed)
Patient presents to the ED with pelvic pain and vomiting that started this morning at 9am when she woke up.  Patient denies vaginal bleeding.  Patient states she is 7-[redacted] weeks pregnant and has had initial pregnancy confirmation at the [facility] department but has not yet seen her Ob-gyn.  Patient is in no obvious distress at this time.

## 2015-01-25 NOTE — ED Provider Notes (Signed)
[facility] Emergency Department Provider Note  ____________________________________________  Time seen: 2:10 PM  I have reviewed the triage vital signs and the nursing notes.   HISTORY  Chief Complaint Pelvic Pain and Emesis During Pregnancy    HPI Melinda Barnett is a 19 y.o. female who complains of bilateral lower abdominal pain with some nausea and vomiting today. She denies any vaginal bleeding contractions leakage of fluid. No fevers chills chest pain shortness of breath or dizziness or syncope. Denies dysuria frequency or urgency. No hematuria.     Past Medical History  Diagnosis Date  . Asthma     There are no active problems to display for this patient.   History reviewed. No pertinent past surgical history.  Current Outpatient Rx  Name  Route  Sig  Dispense  Refill  . albuterol (PROVENTIL HFA;VENTOLIN HFA) 108 (90 BASE) MCG/ACT inhaler   Inhalation   Inhale 2 puffs into the lungs every 6 (six) hours as needed for wheezing or shortness of breath.         Marland Kitchen ibuprofen (ADVIL,MOTRIN) 800 MG tablet   Oral   Take 1 tablet (800 mg total) by mouth every 8 (eight) hours as needed for fever, mild pain, moderate pain or cramping.   15 tablet   0   . nitrofurantoin (MACRODANTIN) 100 MG capsule   Oral   Take 1 capsule (100 mg total) by mouth 2 (two) times daily.   14 capsule   0   . ondansetron (ZOFRAN ODT) 4 MG disintegrating tablet   Oral   Take 1 tablet (4 mg total) by mouth every 8 (eight) hours as needed for nausea or vomiting (take 15 minutes before eating).   20 tablet   0   . potassium chloride (K-DUR) 10 MEQ tablet   Oral   Take 1 tablet (10 mEq total) by mouth daily.   5 tablet   0   . promethazine (PHENERGAN) 12.5 MG tablet   Oral   Take 1 tablet (12.5 mg total) by mouth every 6 (six) hours as needed for nausea or vomiting.   15 tablet   0   . sulfamethoxazole-trimethoprim (BACTRIM DS,SEPTRA DS) 800-160 MG per  tablet   Oral   Take 1 tablet by mouth 2 (two) times daily.   10 tablet   0   . traMADol (ULTRAM) 50 MG tablet   Oral   Take 1 tablet (50 mg total) by mouth every 6 (six) hours as needed.   12 tablet   0     Allergies Review of patient's allergies indicates no known allergies.  No family history on file.  Social History History  Substance Use Topics  . Smoking status: Current Every Day Smoker  . Smokeless tobacco: Not on file  . Alcohol Use: No    Review of Systems  Constitutional: No fever or chills. No weight changes Eyes:No blurry vision or double vision.  ENT: No sore throat. Cardiovascular: No chest pain. Respiratory: No dyspnea or cough. Gastrointestinal: As above.  No BRBPR or melena. Genitourinary: Negative for dysuria, urinary retention, bloody urine, or difficulty urinating. Musculoskeletal: Negative for back pain. No joint swelling or pain. Skin: Negative for rash. Neurological: Negative for headaches, focal weakness or numbness. Psychiatric:No anxiety or depression.   Endocrine:No hot/cold intolerance, changes in energy, or sleep difficulty.  10-point ROS otherwise negative.  ____________________________________________   PHYSICAL EXAM:  VITAL SIGNS: ED Triage Vitals  Enc Vitals Group     BP  01/25/15 1155 105/70 mmHg     Pulse Rate 01/25/15 1155 54     Resp 01/25/15 1155 20     Temp 01/25/15 1155 98.5 F (36.9 C)     Temp src --      SpO2 01/25/15 1155 99 %     Weight 01/25/15 1155 155 lb (70.308 kg)     Height 01/25/15 1155 5\' 1"  (1.549 m)     Head Cir --      Peak Flow --      Pain Score 01/25/15 1155 9     Pain Loc --      Pain Edu? --      Excl. in GC? --      Constitutional: Alert and oriented. Well appearing and in no distress. Eyes: No scleral icterus. No conjunctival pallor. PERRL. EOMI ENT   Head: Normocephalic and atraumatic.   Nose: No congestion/rhinnorhea. No septal hematoma   Mouth/Throat: MMM, no  pharyngeal erythema. No peritonsillar mass. No uvula shift.   Neck: No stridor. No SubQ emphysema. No meningismus. Hematological/Lymphatic/Immunilogical: No cervical lymphadenopathy. Cardiovascular: RRR. Normal and symmetric distal pulses are present in all extremities. No murmurs, rubs, or gallops. Respiratory: Normal respiratory effort without tachypnea nor retractions. Breath sounds are clear and equal bilaterally. No wheezes/rales/rhonchi. Gastrointestinal: Suprapubic tenderness.. No distention. There is no CVA tenderness.  No rebound, rigidity, or guarding. Genitourinary: deferred Musculoskeletal: Nontender with normal range of motion in all extremities. No joint effusions.  No lower extremity tenderness.  No edema. Neurologic:   Normal speech and language.  CN 2-10 normal. Motor grossly intact. No pronator drift.  Normal gait. No gross focal neurologic deficits are appreciated.  Skin:  Skin is warm, dry and intact. No rash noted.  No petechiae, purpura, or bullae. Psychiatric: Mood and affect are normal. Speech and behavior are normal. Patient exhibits appropriate insight and judgment.  ____________________________________________    LABS (pertinent positives/negatives) (all labs ordered are listed, but only abnormal results are displayed) Labs Reviewed  COMPREHENSIVE METABOLIC PANEL - Abnormal; Notable for the following:    BUN 5 (*)    ALT 10 (*)    All other components within normal limits  URINALYSIS COMPLETEWITH MICROSCOPIC ([facility] ONLY) - Abnormal; Notable for the following:    Color, Urine YELLOW (*)    APPearance CLOUDY (*)    Protein, ur 30 (*)    Leukocytes, UA 3+ (*)    Bacteria, UA FEW (*)    Squamous Epithelial / LPF TOO NUMEROUS TO COUNT (*)    All other components within normal limits  URINE CULTURE  LIPASE, BLOOD  CBC  HCG, QUANTITATIVE, PREGNANCY   urine white blood cell count too numerous to  count ____________________________________________   EKG    ____________________________________________    RADIOLOGY    ____________________________________________   PROCEDURES  ____________________________________________   INITIAL IMPRESSION / ASSESSMENT AND PLAN / ED COURSE  Pertinent labs & imaging results that were available during my care of the patient were reviewed by me and considered in my medical decision making (see chart for details).  Patient presents with cystitis and first trimester pregnancy. No evidence of pyelonephritis on labs vitals or clinical exam. We'll give the patient Zofran and do a trial of oral tolerance.  ----------------------------------------- 3:24 PM on 01/25/2015 -----------------------------------------  Tolerating oral intake, nausea resolved, feeling better. We'll continue on Macrobid. Follow-up urine culture sent.  ____________________________________________   FINAL CLINICAL IMPRESSION(S) / ED DIAGNOSES  Final diagnoses:  Cystitis during pregnancy in first  trimester, antepartum      Sharman Cheek, MD 01/25/15 1524

## 2015-01-25 NOTE — Discharge Instructions (Signed)
Pregnancy and Urinary Tract Infection °A urinary tract infection (UTI) is a bacterial infection of the urinary tract. Infection of the urinary tract can include the ureters, kidneys (pyelonephritis), bladder (cystitis), and urethra (urethritis). All pregnant women should be screened for bacteria in the urinary tract. Identifying and treating a UTI will decrease the risk of preterm labor and developing more serious infections in both the mother and baby. °CAUSES °Bacteria germs cause almost all UTIs.  °RISK FACTORS °Many factors can increase your chances of getting a UTI during pregnancy. These include: °· Having a short urethra. °· Poor toilet and hygiene habits. °· Sexual intercourse. °· Blockage of urine along the urinary tract. °· Problems with the pelvic muscles or nerves. °· Diabetes. °· Obesity. °· Bladder problems after having several children. °· Previous history of UTI. °SIGNS AND SYMPTOMS  °· Pain, burning, or a stinging feeling when urinating. °· Suddenly feeling the need to urinate right away (urgency). °· Loss of bladder control (urinary incontinence). °· Frequent urination, more than is common with pregnancy. °· Lower abdominal or back discomfort. °· Cloudy urine. °· Blood in the urine (hematuria). °· Fever.  °When the kidneys are infected, the symptoms may be: °· Back pain. °· Flank pain on the right side more so than the left. °· Fever. °· Chills. °· Nausea. °· Vomiting. °DIAGNOSIS  °A urinary tract infection is usually diagnosed through urine tests. Additional tests and procedures are sometimes done. These may include: °· Ultrasound exam of the kidneys, ureters, bladder, and urethra. °· Looking in the bladder with a lighted tube (cystoscopy). °TREATMENT °Typically, UTIs can be treated with antibiotic medicines.  °HOME CARE INSTRUCTIONS  °· Only take over-the-counter or prescription medicines as directed by your health care provider. If you were prescribed antibiotics, take them as directed. Finish  them even if you start to feel better. °· Drink enough fluids to keep your urine clear or pale yellow. °· Do not have sexual intercourse until the infection is gone and your health care provider says it is okay. °· Make sure you are tested for UTIs throughout your pregnancy. These infections often come back.  °Preventing a UTI in the Future °· Practice good toilet habits. Always wipe from front to back. Use the tissue only once. °· Do not hold your urine. Empty your bladder as soon as possible when the urge comes. °· Do not douche or use deodorant sprays. °· Wash with soap and warm water around the genital area and the anus. °· Empty your bladder before and after sexual intercourse. °· Wear underwear with a cotton crotch. °· Avoid caffeine and carbonated drinks. They can irritate the bladder. °· Drink cranberry juice or take cranberry pills. This may decrease the risk of getting a UTI. °· Do not drink alcohol. °· Keep all your appointments and tests as scheduled.  °SEEK MEDICAL CARE IF:  °· Your symptoms get worse. °· You are still having fevers 2 or more days after treatment begins. °· You have a rash. °· You feel that you are having problems with medicines prescribed. °· You have abnormal vaginal discharge. °SEEK IMMEDIATE MEDICAL CARE IF:  °· You have back or flank pain. °· You have chills. °· You have blood in your urine. °· You have nausea and vomiting. °· You have contractions of your uterus. °· You have a gush of fluid from the vagina. °MAKE SURE YOU: °· Understand these instructions.   °· Will watch your condition.   °· Will get help right away if you are not doing   well or get worse.   °Document Released: 10/18/2010 Document Revised: 04/13/2013 Document Reviewed: 01/20/2013 °ExitCare® Patient Information ©2015 [employer]. This information is not intended to replace advice given to you by your health care provider. Make sure you discuss any questions you have with your health care provider. ° °

## 2015-01-27 LAB — URINE CULTURE: Special Requests: NORMAL

## 2015-01-29 LAB — OB RESULTS CONSOLE HIV ANTIBODY (ROUTINE TESTING): HIV: NONREACTIVE

## 2015-01-29 LAB — OB RESULTS CONSOLE ABO/RH: RH Type: POSITIVE

## 2015-01-29 LAB — OB RESULTS CONSOLE RPR: RPR: NONREACTIVE

## 2015-01-29 LAB — OB RESULTS CONSOLE ANTIBODY SCREEN: ANTIBODY SCREEN: NEGATIVE

## 2015-01-29 LAB — OB RESULTS CONSOLE HEPATITIS B SURFACE ANTIGEN: Hepatitis B Surface Ag: NEGATIVE

## 2015-01-29 LAB — OB RESULTS CONSOLE RUBELLA ANTIBODY, IGM: RUBELLA: NON-IMMUNE/NOT IMMUNE

## 2015-01-29 LAB — OB RESULTS CONSOLE VARICELLA ZOSTER ANTIBODY, IGG: Varicella: NON-IMMUNE/NOT IMMUNE

## 2015-01-31 LAB — OB RESULTS CONSOLE GC/CHLAMYDIA
Chlamydia: NEGATIVE
GC PROBE AMP, GENITAL: NEGATIVE

## 2015-04-12 ENCOUNTER — Encounter: Payer: Self-pay | Admitting: *Deleted

## 2015-04-12 ENCOUNTER — Emergency Department
Admission: EM | Admit: 2015-04-12 | Discharge: 2015-04-12 | Disposition: A | Discharge status: Home / Self Care | Payer: Medicaid Other | Attending: Emergency Medicine | Admitting: Emergency Medicine

## 2015-04-12 DIAGNOSIS — Z79899 Other long term (current) drug therapy: Secondary | ICD-10-CM | POA: Diagnosis not present

## 2015-04-12 DIAGNOSIS — Z87891 Personal history of nicotine dependence: Secondary | ICD-10-CM | POA: Insufficient documentation

## 2015-04-12 DIAGNOSIS — Z792 Long term (current) use of antibiotics: Secondary | ICD-10-CM | POA: Insufficient documentation

## 2015-04-12 DIAGNOSIS — N390 Urinary tract infection, site not specified: Secondary | ICD-10-CM | POA: Diagnosis not present

## 2015-04-12 DIAGNOSIS — R1030 Lower abdominal pain, unspecified: Secondary | ICD-10-CM | POA: Diagnosis present

## 2015-04-12 LAB — URINALYSIS COMPLETE WITH MICROSCOPIC (ARMC ONLY)
Bilirubin Urine: NEGATIVE
GLUCOSE, UA: NEGATIVE mg/dL
Hgb urine dipstick: NEGATIVE
Ketones, ur: NEGATIVE mg/dL
NITRITE: NEGATIVE
PROTEIN: NEGATIVE mg/dL
SPECIFIC GRAVITY, URINE: 1.006 (ref 1.005–1.030)
pH: 6 (ref 5.0–8.0)

## 2015-04-12 LAB — CBC
HCT: 35.4 % (ref 35.0–47.0)
HEMOGLOBIN: 12.2 g/dL (ref 12.0–16.0)
MCH: 31.4 pg (ref 26.0–34.0)
MCHC: 34.5 g/dL (ref 32.0–36.0)
MCV: 91.1 fL (ref 80.0–100.0)
Platelets: 240 10*3/uL (ref 150–440)
RBC: 3.89 MIL/uL (ref 3.80–5.20)
RDW: 13.9 % (ref 11.5–14.5)
WBC: 7.4 10*3/uL (ref 3.6–11.0)

## 2015-04-12 LAB — COMPREHENSIVE METABOLIC PANEL
ALBUMIN: 3.5 g/dL (ref 3.5–5.0)
ALK PHOS: 70 U/L (ref 38–126)
ALT: 16 U/L (ref 14–54)
ANION GAP: 7 (ref 5–15)
AST: 16 U/L (ref 15–41)
BILIRUBIN TOTAL: 0.3 mg/dL (ref 0.3–1.2)
BUN: 7 mg/dL (ref 6–20)
CO2: 21 mmol/L — ABNORMAL LOW (ref 22–32)
Calcium: 9.6 mg/dL (ref 8.9–10.3)
Chloride: 107 mmol/L (ref 101–111)
Creatinine, Ser: 0.4 mg/dL — ABNORMAL LOW (ref 0.44–1.00)
GFR calc non Af Amer: >60 mL/min (ref >60)
GLUCOSE: 80 mg/dL (ref 65–99)
Potassium: 3.9 mmol/L (ref 3.5–5.1)
Sodium: 135 mmol/L (ref 135–145)
TOTAL PROTEIN: 6.6 g/dL (ref 6.5–8.1)

## 2015-04-12 MED ORDER — NITROFURANTOIN MONOHYD MACRO 100 MG PO CAPS: 100.0000 mg | ORAL_CAPSULE | Freq: Two times a day (BID) | ORAL | Status: AC

## 2015-04-12 MED ORDER — NITROFURANTOIN MONOHYD MACRO 100 MG PO CAPS
100.0000 mg | ORAL_CAPSULE | Freq: Once | ORAL | Status: AC
  Administered 2015-04-12: 100 mg via ORAL
  Filled 2015-04-12: qty 1

## 2015-04-12 MED ORDER — NITROFURANTOIN MONOHYD MACRO 100 MG PO CAPS
ORAL_CAPSULE | ORAL | Status: AC
  Filled 2015-04-12: qty 1

## 2015-04-12 NOTE — ED Provider Notes (Addendum)
[facility] Emergency Department Provider Note  Time seen: 8:02 PM  I have reviewed the triage vital signs and the nursing notes.   HISTORY  Chief Complaint Abdominal Pain    HPI Melinda Barnett is a 19 y.o. female G1 P0 [redacted] weeks pregnant presents the emergency department lower abdominal pain 2 days. According to the patient she has had pressure in her lower abdomen since yesterday. Denies any vaginal discharge or bleeding. Denies dysuria. Denies nausea or vomiting. Patient called [facility], was not able to be seen today so came to the emergency department for evaluation. Describes her lower abdominal pressure as mild to moderate.     Past Medical History  Diagnosis Date  . Asthma     There are no active problems to display for this patient.   History reviewed. No pertinent past surgical history.  Current Outpatient Rx  Name  Route  Sig  Dispense  Refill  . albuterol (PROVENTIL HFA;VENTOLIN HFA) 108 (90 BASE) MCG/ACT inhaler   Inhalation   Inhale 2 puffs into the lungs every 6 (six) hours as needed for wheezing or shortness of breath.         Marland Kitchen ibuprofen (ADVIL,MOTRIN) 800 MG tablet   Oral   Take 1 tablet (800 mg total) by mouth every 8 (eight) hours as needed for fever, mild pain, moderate pain or cramping.   15 tablet   0   . nitrofurantoin (MACRODANTIN) 100 MG capsule   Oral   Take 1 capsule (100 mg total) by mouth 2 (two) times daily.   14 capsule   0   . ondansetron (ZOFRAN ODT) 4 MG disintegrating tablet   Oral   Take 1 tablet (4 mg total) by mouth every 8 (eight) hours as needed for nausea or vomiting (take 15 minutes before eating).   20 tablet   0   . potassium chloride (K-DUR) 10 MEQ tablet   Oral   Take 1 tablet (10 mEq total) by mouth daily.   5 tablet   0   . promethazine (PHENERGAN) 12.5 MG tablet   Oral   Take 1 tablet (12.5 mg total) by mouth every 6 (six) hours as needed for nausea or vomiting.   15  tablet   0   . sulfamethoxazole-trimethoprim (BACTRIM DS,SEPTRA DS) 800-160 MG per tablet   Oral   Take 1 tablet by mouth 2 (two) times daily.   10 tablet   0   . traMADol (ULTRAM) 50 MG tablet   Oral   Take 1 tablet (50 mg total) by mouth every 6 (six) hours as needed.   12 tablet   0     Allergies Review of patient's allergies indicates no known allergies.  No family history on file.  Social History Social History  Substance Use Topics  . Smoking status: Former Games developer  . Smokeless tobacco: None  . Alcohol Use: No    Review of Systems Constitutional: Negative for fever Cardiovascular: Negative for chest pain. Respiratory: Negative for shortness of breath. Gastrointestinal: Lower abdominal pressure. Negative for nausea, vomiting, diarrhea. Genitourinary: Negative for dysuria. Negative for hematuria. Negative for vaginal bleeding or discharge. Musculoskeletal: Negative for back pain. Neurological: Negative for headache 10-point ROS otherwise negative.  ____________________________________________   PHYSICAL EXAM:  VITAL SIGNS: ED Triage Vitals  Enc Vitals Group     BP 04/12/15 1802 104/62 mmHg     Pulse Rate 04/12/15 1802 69     Resp 04/12/15 1802 18  Temp 04/12/15 1802 98.2 F (36.8 C)     Temp Source 04/12/15 1802 Oral     SpO2 04/12/15 1802 97 %     Weight 04/12/15 1802 147 lb (66.679 kg)     Height 04/12/15 1802  (1.549 m)     Head Cir --      Peak Flow --      Pain Score 04/12/15 1802 10     Pain Loc --      Pain Edu? --      Excl. in GC? --     Constitutional: Alert and oriented. Well appearing and in no distress. Eyes: Normal exam ENT   Head: Normocephalic and atraumatic.   Mouth/Throat: Mucous membranes are moist. Cardiovascular: Normal rate, regular rhythm. No murmurs, rubs, or gallops. Respiratory: Normal respiratory effort without tachypnea nor retractions. Breath sounds are clear and equal bilaterally. No  wheezes/rales/rhonchi. Gastrointestinal: Gravid uterus, mild suprapubic tenderness palpation. No rebound or guarding.  Musculoskeletal: Nontender with normal range of motion in all extremities Neurologic:  Normal speech and language. No gross focal neurologic deficits  Psychiatric: Mood and affect are normal. Speech and behavior are normal.       INITIAL IMPRESSION / ASSESSMENT AND PLAN / ED COURSE  Pertinent labs & imaging results that were available during my care of the patient were reviewed by me and considered in my medical decision making (see chart for details).  Labs are consistent with a urinary tract infection. We will treat with Macrobid and send a urine culture. Bedside ultrasound shows normal fetal heart rate around 150 bpm, great fetal movement, posterior placenta. We'll place the patient on antibiotics, urine culture has been sent, patient follow up with her OB/GYN next week for recheck of her urinalysis, and reevaluation. Patient agreeable to plan. Urinalysis does show budding yeast, however it appears that most of the systemic intact focal or class C or worse. I will send a urine culture, treat with Macrobid and have the patient follow-up with OB/GYN for reevaluation. Patient agreeable.   ____________________________________________   FINAL CLINICAL IMPRESSION(S) / ED DIAGNOSES  UTI   Minna Antis, MD 04/12/15 2005  Minna Antis, MD 04/12/15 2007

## 2015-04-12 NOTE — ED Notes (Signed)
Pt reports lower abdominal "sharp" "pressure" and lower back pain since yesterday, was intermittent, now constant. Pt is [redacted]w[redacted]d pregnant, pt of [facility]. Pt denies vaginal bleeding, discharge, urinary symptoms.

## 2015-04-12 NOTE — ED Notes (Signed)
MD Paduchowski at bedside  

## 2015-04-12 NOTE — ED Notes (Signed)
Lab called regarding add on urine culture

## 2015-04-12 NOTE — Discharge Instructions (Signed)

## 2015-04-12 NOTE — ED Notes (Signed)
FHT- 156 LLQ Maternal heart rate 70

## 2015-04-14 LAB — URINE CULTURE

## 2015-05-02 ENCOUNTER — Emergency Department
Admission: EM | Admit: 2015-05-02 | Discharge: 2015-05-02 | Disposition: A | Discharge status: Home / Self Care | Payer: Medicaid Other | Attending: Emergency Medicine | Admitting: Emergency Medicine

## 2015-05-02 ENCOUNTER — Encounter: Payer: Self-pay | Admitting: *Deleted

## 2015-05-02 DIAGNOSIS — J0100 Acute maxillary sinusitis, unspecified: Secondary | ICD-10-CM | POA: Diagnosis not present

## 2015-05-02 DIAGNOSIS — O9989 Other specified diseases and conditions complicating pregnancy, childbirth and the puerperium: Secondary | ICD-10-CM | POA: Insufficient documentation

## 2015-05-02 DIAGNOSIS — Z3A20 20 weeks gestation of pregnancy: Secondary | ICD-10-CM | POA: Insufficient documentation

## 2015-05-02 DIAGNOSIS — R197 Diarrhea, unspecified: Secondary | ICD-10-CM | POA: Diagnosis not present

## 2015-05-02 DIAGNOSIS — Z792 Long term (current) use of antibiotics: Secondary | ICD-10-CM | POA: Diagnosis not present

## 2015-05-02 DIAGNOSIS — O99512 Diseases of the respiratory system complicating pregnancy, second trimester: Secondary | ICD-10-CM | POA: Insufficient documentation

## 2015-05-02 DIAGNOSIS — O210 Mild hyperemesis gravidarum: Secondary | ICD-10-CM | POA: Diagnosis not present

## 2015-05-02 DIAGNOSIS — J45909 Unspecified asthma, uncomplicated: Secondary | ICD-10-CM | POA: Insufficient documentation

## 2015-05-02 DIAGNOSIS — Z79899 Other long term (current) drug therapy: Secondary | ICD-10-CM | POA: Diagnosis not present

## 2015-05-02 DIAGNOSIS — Z87891 Personal history of nicotine dependence: Secondary | ICD-10-CM | POA: Diagnosis not present

## 2015-05-02 MED ORDER — AMOXICILLIN 500 MG PO CAPS: 500.0000 mg | ORAL_CAPSULE | Freq: Three times a day (TID) | ORAL | Status: DC

## 2015-05-02 MED ORDER — PROMETHAZINE HCL 25 MG PO TABS: 25.0000 mg | ORAL_TABLET | Freq: Four times a day (QID) | ORAL | Status: DC | PRN

## 2015-05-02 MED ORDER — IBUPROFEN 800 MG PO TABS: 800.0000 mg | ORAL_TABLET | Freq: Three times a day (TID) | ORAL | Status: DC | PRN

## 2015-05-02 NOTE — ED Notes (Signed)
Pt here with c/o cough, runny nose, sore throat since Friday.

## 2015-05-02 NOTE — ED Notes (Signed)
Chest congestion, cough, fever, runny nose since Friday. Vomiting and diarrhea.

## 2015-05-02 NOTE — Discharge Instructions (Signed)
Sinusitis, Adult  Sinusitis is redness, soreness, and puffiness (inflammation) of the air pockets in the bones of your face (sinuses). The redness, soreness, and puffiness can cause air and mucus to get trapped in your sinuses. This can allow germs to grow and cause an infection.   HOME CARE    Drink enough fluids to keep your pee (urine) clear or pale yellow.   Use a humidifier in your home.   Run a hot shower to create steam in the bathroom. Sit in the bathroom with the door closed. Breathe in the steam 3-4 times a day.   Put a warm, moist washcloth on your face 3-4 times a day, or as told by your doctor.   Use salt water sprays (saline sprays) to wet the thick fluid in your nose. This can help the sinuses drain.   Only take medicine as told by your doctor.  GET HELP RIGHT AWAY IF:    Your pain gets worse.   You have very bad headaches.   You are sick to your stomach (nauseous).   You throw up (vomit).   You are very sleepy (drowsy) all the time.   Your face is puffy (swollen).   Your vision changes.   You have a stiff neck.   You have trouble breathing.  MAKE SURE YOU:    Understand these instructions.   Will watch your condition.   Will get help right away if you are not doing well or get worse.     This information is not intended to replace advice given to you by your health care provider. Make sure you discuss any questions you have with your health care provider.     Document Released: 12/10/2007 Document Revised: 07/14/2014 Document Reviewed: 01/27/2012  Elsevier Interactive Patient Education 2016 Elsevier Inc[employer]

## 2015-05-02 NOTE — ED Provider Notes (Signed)
[facility] Emergency Department Provider Note  ____________________________________________  Time seen: Approximately 2:28 PM  I have reviewed the triage vital signs and the nursing notes.   HISTORY  Chief Complaint Cough    HPI Melinda Barnett is a 19 y.o. female female complaining of cough fever and rhinorrhea with vomiting and diarrhea. Patient is [redacted] weeks gestation. Patient states the cough is a nonproductive cough which is causing her chest wall pain. Patient states she's having postnasal drainage is concerned because she did. Patient states she has not taken any medicine for this complaint secondary to her gestation of state. Patient rated her pain discomfort as a 10 over 10.Patient recently completed antibiotics for urinary tract infection.   Past Medical History  Diagnosis Date  . Asthma     There are no active problems to display for this patient.   History reviewed. No pertinent past surgical history.  Current Outpatient Rx  Name  Route  Sig  Dispense  Refill  . albuterol (PROVENTIL HFA;VENTOLIN HFA) 108 (90 BASE) MCG/ACT inhaler   Inhalation   Inhale 2 puffs into the lungs every 6 (six) hours as needed for wheezing or shortness of breath.         Marland Kitchen amoxicillin (AMOXIL) 500 MG capsule   Oral   Take 1 capsule (500 mg total) by mouth 3 (three) times daily.   30 capsule   0   . ibuprofen (ADVIL,MOTRIN) 800 MG tablet   Oral   Take 1 tablet (800 mg total) by mouth every 8 (eight) hours as needed for fever, mild pain, moderate pain or cramping.   15 tablet   0   . ibuprofen (ADVIL,MOTRIN) 800 MG tablet   Oral   Take 1 tablet (800 mg total) by mouth every 8 (eight) hours as needed for moderate pain.   15 tablet   0   . nitrofurantoin (MACRODANTIN) 100 MG capsule   Oral   Take 1 capsule (100 mg total) by mouth 2 (two) times daily.   14 capsule   0   . ondansetron (ZOFRAN ODT) 4 MG disintegrating tablet   Oral   Take 1 tablet (4  mg total) by mouth every 8 (eight) hours as needed for nausea or vomiting (take 15 minutes before eating).   20 tablet   0   . potassium chloride (K-DUR) 10 MEQ tablet   Oral   Take 1 tablet (10 mEq total) by mouth daily.   5 tablet   0   . promethazine (PHENERGAN) 12.5 MG tablet   Oral   Take 1 tablet (12.5 mg total) by mouth every 6 (six) hours as needed for nausea or vomiting.   15 tablet   0   . promethazine (PHENERGAN) 25 MG tablet   Oral   Take 1 tablet (25 mg total) by mouth every 6 (six) hours as needed for nausea or vomiting.   30 tablet   0   . sulfamethoxazole-trimethoprim (BACTRIM DS,SEPTRA DS) 800-160 MG per tablet   Oral   Take 1 tablet by mouth 2 (two) times daily.   10 tablet   0   . traMADol (ULTRAM) 50 MG tablet   Oral   Take 1 tablet (50 mg total) by mouth every 6 (six) hours as needed.   12 tablet   0     Allergies Review of patient's allergies indicates no known allergies.  No family history on file.  Social History Social History  Substance Use Topics  .  Smoking status: Former Games developermoker  . Smokeless tobacco: None  . Alcohol Use: No    Review of Systems Constitutional: No fever/chills Eyes: No visual changes. ENT: No sore throat. Cardiovascular: Denies chest pain. Respiratory: Denies shortness of breath. Gastrointestinal: No abdominal pain.  No nausea, no vomiting.  No diarrhea.  No constipation. Genitourinary: Negative for dysuria. Musculoskeletal: Negative for back pain. Skin: Negative for rash. Neurological: Negative for headaches, focal weakness or numbness.  10-point ROS otherwise negative.  ____________________________________________   PHYSICAL EXAM:  VITAL SIGNS: ED Triage Vitals  Enc Vitals Group     BP 05/02/15 1344 106/73 mmHg     Pulse Rate 05/02/15 1344 82     Resp 05/02/15 1344 18     Temp 05/02/15 1344 98.5 F (36.9 C)     Temp Source 05/02/15 1344 Oral     SpO2 05/02/15 1344 99 %     Weight 05/02/15 1344  157 lb (71.215 kg)     Height 05/02/15 1344 5\' 1"  (1.549 m)     Head Cir --      Peak Flow --      Pain Score 05/02/15 1344 10     Pain Loc --      Pain Edu? --      Excl. in GC? --     Constitutional: Alert and oriented. Well appearing and in no acute distress. Eyes: Conjunctivae are normal. PERRL. EOMI. Head: Atraumatic. Nose: Bilateral maxillary guarding. Edematous nasal turbinates.  Mouth/Throat: Mucous membranes are moist.  Oropharynx erythematous with non-exudative edematous tonsils. Neck: No stridor.  No cervical spine tenderness to palpation. Hematological/Lymphatic/Immunilogical: No cervical lymphadenopathy. Cardiovascular: Normal rate, regular rhythm. Grossly normal heart sounds.  Good peripheral circulation. Respiratory: Normal respiratory effort.  No retractions. Lungs CTAB. Nonproductive cough. Gastrointestinal: Soft and nontender. No distention. No abdominal bruits. No CVA tenderness. Musculoskeletal: No lower extremity tenderness nor edema.  No joint effusions. Neurologic:  Normal speech and language. No gross focal neurologic deficits are appreciated. No gait instability. Skin:  Skin is warm, dry and intact. No rash noted. Psychiatric: Mood and affect are normal. Speech and behavior are normal.  ____________________________________________   LABS (all labs ordered are listed, but only abnormal results are displayed)  Labs Reviewed - No data to display ____________________________________________  EKG   ____________________________________________  RADIOLOGY   ____________________________________________   PROCEDURES  Procedure(s) performed: None  Critical Care performed: No  ____________________________________________   INITIAL IMPRESSION / ASSESSMENT AND PLAN / ED COURSE  Pertinent labs & imaging results that were available during my care of the patient were reviewed by me and considered in my medical decision making (see chart for  details).  Upper rest or infection. Patient given prescription Motrin and amoxicillin, and Phenergan.. Patient given a work note for 2 days. Patient advised to [facility] if this condition persists. ____________________________________________   FINAL CLINICAL IMPRESSION(S) / ED DIAGNOSES  Final diagnoses:  Subacute maxillary sinusitis      Joni ReiningRonald K Smith, PA-C 05/02/15 1438  Jeanmarie PlantJames A McShane, MD 05/02/15 [phone]

## 2015-05-02 NOTE — ED Notes (Signed)
Pt alert and oriented X4, active, cooperative, pt in NAD. RR even and unlabored, color WNL.  Pt informed to return if any life threatening symptoms occur.   

## 2015-05-21 ENCOUNTER — Ambulatory Visit
Admission: RE | Admit: 2015-05-21 | Discharge: 2015-05-21 | Disposition: A | Discharge status: Home / Self Care | Payer: Medicaid Other | Source: Ambulatory Visit | Attending: Obstetrics and Gynecology | Admitting: Obstetrics and Gynecology

## 2015-05-21 VITALS — BP 106/61 | HR 98 | Temp 98.7°F | Wt 156.0 lb

## 2015-05-21 DIAGNOSIS — R55 Syncope and collapse: Secondary | ICD-10-CM | POA: Diagnosis not present

## 2015-05-21 HISTORY — DX: Headache, unspecified: R51.9

## 2015-05-21 HISTORY — DX: Headache: R51

## 2015-05-21 NOTE — Progress Notes (Signed)
[facility] Consultation   Chief Complaint: "blacking out" with headaches during pregnancy  HPI: Ms. Melinda Barnett is a 19 y.o. G1P0 at [redacted]w[redacted]d by L=[redacted]w[redacted]d [place] who presents in consultation from [facility] for evaluation of syncopal episodes during pregnancy.  Ms. Melinda Barnett states that she's been getting daily HAs since about age 25.  They only last about 30 minutes and she's never seen a physician for evaluation or classification of these HAs.  She does not experience auras or N/V with these HAs but is light sensitive.  She does not take medication for them.  Since she became pregnant, she has had 3 episodes of "blacking out" that were precipitated by a unilateral temporal HA, then "specks" in front of the eyes, followed by blacking out.  Episodes have lasted from 5 minutes to 30 minutes.  Ms. Melinda Barnett states that she was seen in the ED after the one lasting 30 minutes but no encounter seen in [facility] specifically addresses syncopal episodes.  All episodes have involved long periods of standing prior to blacking out and all have been witnessed (one at work, one outside with her boyfriend and one at home).  She is never disoriented when she awakens.  She has never been incontinent during these episodes and she has never been witnessed to be shaking.  She has never hurt herself when blacking out by falling.  Past Medical History: Patient  has a past medical history of Asthma and Headache (19 years old).  Past Surgical History: She  has past surgical history that includes No past surgeries.  Obstetric History:  OB History    Gravida Para Term Preterm AB TAB SAB Ectopic Multiple Living   1              Gynecologic History:  Patient's last menstrual period was 12/01/2014.   Medications:  Current Outpatient Prescriptions on File Prior to Encounter  Medication Sig Dispense Refill  . amoxicillin (AMOXIL) 500 MG capsule Take 1 capsule (500 mg total) by mouth 3 (three) times daily. 30 capsule  0  . ondansetron (ZOFRAN ODT) 4 MG disintegrating tablet Take 1 tablet (4 mg total) by mouth every 8 (eight) hours as needed for nausea or vomiting (take 15 minutes before eating). 20 tablet 0  . PNVs  daily 1 tab   . Qvar 1-4 puffs inhaled BID      Allergies: Patient has No Known Allergies.  Social History: Patient  reports that she has quit smoking. She has never used smokeless tobacco. She reports that she does not drink alcohol or use illicit drugs. Father of the baby is involved and she feels safe with him.  Not currently working (since she blacked out at work). Family History: Father with diabetes, Mother with HTN.  3 brothers healthy.  No history of sudden death, cardiomyopathy, heart disease, MI, clots, MR, birth defects Review of Systems A full 12 point review of systems was negative or as noted in the History of Present Illness.  Physical Exam: BP 106/61 mmHg  Pulse 98  Temp(Src) 98.7 F (37.1 C)  Wt 156 lb (70.761 kg)  LMP 12/01/2014  Orthostatics: Lying:  108/59  71   Sitting:  103/56  77 Standing:  107/92  96-->82  05/16/15:  Hct 33, MCV 92, plts 250 CMP wnls TSH 0.862 uIU/mL (nl)  EKG June 2016 normal sinus rhythm  Asessement: Syncopal episodes associated with HAs and prolonged standing  Plan: 1.  Syncopal episodes  Suspect based on history and  normal EKG, lack of significant anemia or electrolyte abnormalities that syncopal episodes are either vasovagal (precipitated by long periods of standing without moving) or due to orthostatic changes (such as postural orthostatic tachycardia syndrome).    Recommend staying hydrated at all times.  Patient states she does usually have a water bottle with her - recommend hydrating until urine clear to pale yellow.   Also encourage constant movement of lower extremities while standing, either by alternating stance or bending at the knee, alternating between legs. Lastly, recommend premedicating headaches at onset (of seeing  "specks" which differentiate normal HAs from ones that lead to syncope)  to see if better management of HAs can help to prevent syncopal episodes.  Given brevity of HAs, would start with extra strength tylenol.  If syncopal episodes continue, consider evaluation by neurology.  2.  Asthma  Currently well controlled on Qvar (taking only 1/week).  Recommend checking peak flows.   Total time spent with the patient was 30 minutes with greater than 50% spent in counseling and coordination of care. We appreciate this interesting consult and will be happy to be involved in the ongoing care of Ms. Melinda Barnett in anyway her obstetricians desire.  Kirby FunkSarah Piotr Christopher, MD Maternal[facility]Fetal Medicine [facility]

## 2015-05-25 ENCOUNTER — Emergency Department
Admission: EM | Admit: 2015-05-25 | Discharge: 2015-05-25 | Disposition: A | Discharge status: Home / Self Care | Payer: Medicaid Other | Attending: Emergency Medicine | Admitting: Emergency Medicine

## 2015-05-25 ENCOUNTER — Emergency Department: Payer: Medicaid Other

## 2015-05-25 ENCOUNTER — Encounter: Payer: Self-pay | Admitting: Emergency Medicine

## 2015-05-25 DIAGNOSIS — R05 Cough: Secondary | ICD-10-CM | POA: Diagnosis present

## 2015-05-25 DIAGNOSIS — Z792 Long term (current) use of antibiotics: Secondary | ICD-10-CM | POA: Diagnosis not present

## 2015-05-25 DIAGNOSIS — J069 Acute upper respiratory infection, unspecified: Secondary | ICD-10-CM | POA: Insufficient documentation

## 2015-05-25 DIAGNOSIS — Z87891 Personal history of nicotine dependence: Secondary | ICD-10-CM | POA: Insufficient documentation

## 2015-05-25 DIAGNOSIS — J45901 Unspecified asthma with (acute) exacerbation: Secondary | ICD-10-CM | POA: Diagnosis not present

## 2015-05-25 DIAGNOSIS — Z79899 Other long term (current) drug therapy: Secondary | ICD-10-CM | POA: Insufficient documentation

## 2015-05-25 MED ORDER — AZITHROMYCIN 250 MG PO TABS
500.0000 mg | ORAL_TABLET | Freq: Once | ORAL | Status: AC
  Administered 2015-05-25: 500 mg via ORAL
  Filled 2015-05-25: qty 2

## 2015-05-25 MED ORDER — AZITHROMYCIN 500 MG PO TABS: 500.0000 mg | ORAL_TABLET | Freq: Every day | ORAL | Status: AC

## 2015-05-25 NOTE — ED Provider Notes (Signed)
[facility] Emergency Department Provider Note  ____________________________________________  Time seen: 4:30 AM  I have reviewed the triage vital signs and the nursing notes.   HISTORY  Chief Complaint Cough; Emesis; and Headache      HPI Melinda Barnett is a 18 y.o. female presents with cough congestion times few days patient states that she was seen in prescribed an antibiotic and completed a course however has not improved patent. Patient states that the cough is nonproductive with posttussive emesis. Of note patient was seen on 05/02/2015 for cough and prescribed amoxicillin.     Past Medical History  Diagnosis Date  . Asthma   . Headache 19 years old    There are no active problems to display for this patient.   Past Surgical History  Procedure Laterality Date  . No past surgeries      Current Outpatient Rx  Name  Route  Sig  Dispense  Refill  . albuterol (PROVENTIL HFA;VENTOLIN HFA) 108 (90 BASE) MCG/ACT inhaler   Inhalation   Inhale 2 puffs into the lungs every 6 (six) hours as needed for wheezing or shortness of breath.         . Prenatal Vit-Fe Fumarate-FA (PRENATAL MULTIVITAMIN) TABS tablet   Oral   Take 1 tablet by mouth daily at 12 noon.         Marland Kitchen amoxicillin (AMOXIL) 500 MG capsule   Oral   Take 1 capsule (500 mg total) by mouth 3 (three) times daily.   30 capsule   0   . ibuprofen (ADVIL,MOTRIN) 800 MG tablet   Oral   Take 1 tablet (800 mg total) by mouth every 8 (eight) hours as needed for fever, mild pain, moderate pain or cramping. Patient not taking: Reported on 05/21/2015   15 tablet   0   . ibuprofen (ADVIL,MOTRIN) 800 MG tablet   Oral   Take 1 tablet (800 mg total) by mouth every 8 (eight) hours as needed for moderate pain. Patient not taking: Reported on 05/21/2015   15 tablet   0   . nitrofurantoin (MACRODANTIN) 100 MG capsule   Oral   Take 1 capsule (100 mg total) by mouth 2 (two) times  daily. Patient not taking: Reported on 05/21/2015   14 capsule   0   . ondansetron (ZOFRAN ODT) 4 MG disintegrating tablet   Oral   Take 1 tablet (4 mg total) by mouth every 8 (eight) hours as needed for nausea or vomiting (take 15 minutes before eating).   20 tablet   0   . potassium chloride (K-DUR) 10 MEQ tablet   Oral   Take 1 tablet (10 mEq total) by mouth daily. Patient not taking: Reported on 05/21/2015   5 tablet   0   . promethazine (PHENERGAN) 12.5 MG tablet   Oral   Take 1 tablet (12.5 mg total) by mouth every 6 (six) hours as needed for nausea or vomiting. Patient not taking: Reported on 05/21/2015   15 tablet   0   . promethazine (PHENERGAN) 25 MG tablet   Oral   Take 1 tablet (25 mg total) by mouth every 6 (six) hours as needed for nausea or vomiting. Patient not taking: Reported on 05/21/2015   30 tablet   0   . sulfamethoxazole-trimethoprim (BACTRIM DS,SEPTRA DS) 800-160 MG per tablet   Oral   Take 1 tablet by mouth 2 (two) times daily. Patient not taking: Reported on 05/21/2015   10 tablet  0   . traMADol (ULTRAM) 50 MG tablet   Oral   Take 1 tablet (50 mg total) by mouth every 6 (six) hours as needed. Patient not taking: Reported on 05/21/2015   12 tablet   0     Allergies Review of patient's allergies indicates no known allergies.  History reviewed. No pertinent family history.  Social History Social History  Substance Use Topics  . Smoking status: Former Games developermoker  . Smokeless tobacco: Never Used  . Alcohol Use: No    Review of Systems  Constitutional: Negative for fever. Eyes: Negative for visual changes. ENT: Negative for sore throat. Cardiovascular: Negative for chest pain. Respiratory: Positive for shortness of breath. Gastrointestinal: Negative for abdominal pain, vomiting and diarrhea. Genitourinary: Negative for dysuria. Musculoskeletal: Negative for back pain. Skin: Negative for rash. Neurological: Negative for  headaches, focal weakness or numbness.   10-point ROS otherwise negative.  ____________________________________________   PHYSICAL EXAM:  VITAL SIGNS: ED Triage Vitals  Enc Vitals Group     BP 05/25/15 0411 107/62 mmHg     Pulse Rate 05/25/15 0411 85     Resp 05/25/15 0411 20     Temp 05/25/15 0411 98.7 F (37.1 C)     Temp Source 05/25/15 0411 Oral     SpO2 05/25/15 0411 97 %     Weight 05/25/15 0411 155 lb (70.308 kg)     Height 05/25/15 0411 5\' 1"  (1.549 m)     Head Cir --      Peak Flow --      Pain Score 05/25/15 0416 9     Pain Loc --      Pain Edu? --      Excl. in GC? --    Constitutional: Alert and oriented. Well appearing and in no distress. Eyes: Conjunctivae are normal. PERRL. Normal extraocular movements. ENT   Head: Normocephalic and atraumatic.   Nose: No congestion/rhinnorhea.   Mouth/Throat: Mucous membranes are moist.   Neck: No stridor. Hematological/Lymphatic/Immunilogical: No cervical lymphadenopathy. Cardiovascular: Normal rate, regular rhythm. Normal and symmetric distal pulses are present in all extremities. No murmurs, rubs, or gallops. Respiratory: Normal respiratory effort without tachypnea nor retractions. Breath sounds are clear and equal bilaterally. No wheezes/rales/rhonchi. Gastrointestinal: Soft and nontender. No distention. There is no CVA tenderness. Genitourinary: deferred Musculoskeletal: Nontender with normal range of motion in all extremities. No joint effusions.  No lower extremity tenderness nor edema. Neurologic:  Normal speech and language. No gross focal neurologic deficits are appreciated. Speech is normal.  Skin:  Skin is warm, dry and intact. No rash noted. Psychiatric: Mood and affect are normal. Speech and behavior are normal. Patient exhibits appropriate insight and judgment.      RADIOLOGY  Results       DG Chest 2 View (Final result) Result time: 05/25/15 06:17:00   Final result by Rad Results In  Interface (05/25/15 06:17:00)   Narrative:   CLINICAL DATA: Cough and fever. Cough for 2 days. Pregnant patient, abdomen shielded.  EXAM: CHEST 2 VIEW  COMPARISON: 12/08/2014  FINDINGS: The cardiomediastinal contours are normal. The lungs are clear. Pulmonary vasculature is normal. No consolidation, pleural effusion, or pneumothorax. No acute osseous abnormalities are seen.  IMPRESSION: No acute pulmonary process.   Electronically Signed By: Rubye OaksMelanie Ehinger M.D. On: 05/25/2015 06:17         INITIAL IMPRESSION / ASSESSMENT AND PLAN / ED COURSE  Pertinent labs & imaging results that were available during my care of the patient were  reviewed by me and considered in my medical decision making (see chart for details).   History of physical exam consistent with upper respiratory tract infection. ____________________________________________   FINAL CLINICAL IMPRESSION(S) / ED DIAGNOSES  Final diagnoses:  URI (upper respiratory infection)      Darci Current, MD 05/25/15 [phone]

## 2015-05-25 NOTE — ED Notes (Addendum)
Pt reports cough x a few days, pt seen in ED and prescribed antibiotics and finished course.  PT states it has not improved.  Pt states cough non-productive.  Pt reports vomiting x 5 times, episodes coincide with coughing fits.  Pt reports fever of 100 PTA, states did not take any medication, pt afebrile at triage.  Pt reports HA, sore throat.  Pt NAD at this time.   According to pt records, pt last seen in ED on 10/26 for cough

## 2015-05-25 NOTE — Discharge Instructions (Signed)
Upper Respiratory Infection, Adult Most upper respiratory infections (URIs) are a viral infection of the air passages leading to the lungs. A URI affects the nose, throat, and upper air passages. The most common type of URI is nasopharyngitis and is typically referred to as "the common cold." URIs run their course and usually go away on their own. Most of the time, a URI does not require medical attention, but sometimes a bacterial infection in the upper airways can follow a viral infection. This is called a secondary infection. Sinus and middle ear infections are common types of secondary upper respiratory infections. Bacterial pneumonia can also complicate a URI. A URI can worsen asthma and chronic obstructive pulmonary disease (COPD). Sometimes, these complications can require emergency medical care and may be life threatening.  CAUSES Almost all URIs are caused by viruses. A virus is a type of germ and can spread from one person to another.  RISKS FACTORS You may be at risk for a URI if:   You smoke.   You have chronic heart or lung disease.  You have a weakened defense (immune) system.   You are very young or very old.   You have nasal allergies or asthma.  You work in crowded or poorly ventilated areas.  You work in health care facilities or schools. SIGNS AND SYMPTOMS  Symptoms typically develop 2-3 days after you come in contact with a cold virus. Most viral URIs last 7-10 days. However, viral URIs from the influenza virus (flu virus) can last 14-18 days and are typically more severe. Symptoms may include:   Runny or stuffy (congested) nose.   Sneezing.   Cough.   Sore throat.   Headache.   Fatigue.   Fever.   Loss of appetite.   Pain in your forehead, behind your eyes, and over your cheekbones (sinus pain).  Muscle aches.  DIAGNOSIS  Your health care provider may diagnose a URI by:  Physical exam.  Tests to check that your symptoms are not due to  another condition such as:  Strep throat.  Sinusitis.  Pneumonia.  Asthma. TREATMENT  A URI goes away on its own with time. It cannot be cured with medicines, but medicines may be prescribed or recommended to relieve symptoms. Medicines may help:  Reduce your fever.  Reduce your cough.  Relieve nasal congestion. HOME CARE INSTRUCTIONS   Take medicines only as directed by your health care provider.   Gargle warm saltwater or take cough drops to comfort your throat as directed by your health care provider.  Use a warm mist humidifier or inhale steam from a shower to increase air moisture. This may make it easier to breathe.  Drink enough fluid to keep your urine clear or pale yellow.   Eat soups and other clear broths and maintain good nutrition.   Rest as needed.   Return to work when your temperature has returned to normal or as your health care provider advises. You may need to stay home longer to avoid infecting others. You can also use a face mask and careful hand washing to prevent spread of the virus.  Increase the usage of your inhaler if you have asthma.   Do not use any tobacco products, including cigarettes, chewing tobacco, or electronic cigarettes. If you need help quitting, ask your health care provider. PREVENTION  The best way to protect yourself from getting a cold is to practice good hygiene.   Avoid oral or hand contact with people with cold   symptoms.   Wash your hands often if contact occurs.  There is no clear evidence that vitamin C, vitamin E, echinacea, or exercise reduces the chance of developing a cold. However, it is always recommended to get plenty of rest, exercise, and practice good nutrition.  SEEK MEDICAL CARE IF:   You are getting worse rather than better.   Your symptoms are not controlled by medicine.   You have chills.  You have worsening shortness of breath.  You have Melinda Barnett or red mucus.  You have yellow or Melinda Barnett nasal  discharge.  You have pain in your face, especially when you bend forward.  You have a fever.  You have swollen neck glands.  You have pain while swallowing.  You have white areas in the back of your throat. SEEK IMMEDIATE MEDICAL CARE IF:   You have severe or persistent:  Headache.  Ear pain.  Sinus pain.  Chest pain.  You have chronic lung disease and any of the following:  Wheezing.  Prolonged cough.  Coughing up blood.  A change in your usual mucus.  You have a stiff neck.  You have changes in your:  Vision.  Hearing.  Thinking.  Mood. MAKE SURE YOU:   Understand these instructions.  Will watch your condition.  Will get help right away if you are not doing well or get worse.   This information is not intended to replace advice given to you by your health care provider. Make sure you discuss any questions you have with your health care provider.   Document Released: 12/17/2000 Document Revised: 11/07/2014 Document Reviewed: 09/28/2013 Elsevier Interactive Patient Education 2016 Elsevier Inc.  

## 2015-05-25 NOTE — ED Notes (Signed)
Pt returned from xray

## 2015-05-25 NOTE — ED Notes (Signed)
Pt arrived to the ED for complaints of cough,fever and vomiting x1. Pt states that she has been experiencing cough for 2 days, seen in the ED for the same  and has not gotten better. Pt is AOx4 in no apparent distress.

## 2015-06-11 ENCOUNTER — Observation Stay
Admission: EM | Admit: 2015-06-11 | Discharge: 2015-06-11 | Disposition: A | Discharge status: Home / Self Care | Payer: Medicaid Other | Attending: Certified Nurse Midwife | Admitting: Certified Nurse Midwife

## 2015-06-11 ENCOUNTER — Encounter: Payer: Self-pay | Admitting: *Deleted

## 2015-06-11 DIAGNOSIS — R102 Pelvic and perineal pain: Secondary | ICD-10-CM | POA: Diagnosis present

## 2015-06-11 DIAGNOSIS — Z3A27 27 weeks gestation of pregnancy: Secondary | ICD-10-CM | POA: Diagnosis not present

## 2015-06-11 DIAGNOSIS — O26892 Other specified pregnancy related conditions, second trimester: Secondary | ICD-10-CM | POA: Diagnosis not present

## 2015-06-11 DIAGNOSIS — O26899 Other specified pregnancy related conditions, unspecified trimester: Secondary | ICD-10-CM | POA: Diagnosis present

## 2015-06-11 HISTORY — DX: Anemia complicating pregnancy, second trimester: O99.012

## 2015-06-11 LAB — URINALYSIS COMPLETE WITH MICROSCOPIC (ARMC ONLY)
BACTERIA UA: NONE SEEN
BILIRUBIN URINE: NEGATIVE
Glucose, UA: NEGATIVE mg/dL
HGB URINE DIPSTICK: NEGATIVE
Ketones, ur: NEGATIVE mg/dL
NITRITE: NEGATIVE
Protein, ur: NEGATIVE mg/dL
SPECIFIC GRAVITY, URINE: 1.002 — AB (ref 1.005–1.030)
pH: 6 (ref 5.0–8.0)

## 2015-06-11 LAB — CHLAMYDIA/NGC RT PCR (ARMC ONLY)
Chlamydia Tr: NOT DETECTED
N gonorrhoeae: NOT DETECTED

## 2015-06-11 LAB — FETAL FIBRONECTIN: Fetal Fibronectin: POSITIVE — AB

## 2015-06-11 MED ORDER — CEPHALEXIN 500 MG PO CAPS
500.0000 mg | ORAL_CAPSULE | Freq: Three times a day (TID) | ORAL | Status: DC
  Administered 2015-06-11: 500 mg via ORAL
  Filled 2015-06-11 (×3): qty 1

## 2015-06-11 MED ORDER — BETAMETHASONE SOD PHOS & ACET 6 (3-3) MG/ML IJ SUSP
12.0000 mg | Freq: Once | INTRAMUSCULAR | Status: AC
  Administered 2015-06-11: 12 mg via INTRAMUSCULAR
  Filled 2015-06-11: qty 1

## 2015-06-11 NOTE — OB Triage Note (Signed)
Notify physician for:   Menstrual like cramps Leaking of fluid Bleeding Low, dull backache unrelieved by Tylenol Intestinal cramps, with or without diarrhea Uterine contractions. May feel painless and feel like the uterus is tightening or the baby is "balling up" Pelvic pressure General feeling that something is not right  No restrictions on diet or activity

## 2015-06-11 NOTE — OB Triage Note (Signed)
[id number] G1 presents for 'pelvic pressure'

## 2015-06-11 NOTE — Discharge Instructions (Signed)
Notify physician for:   Menstrual like cramps Leaking of fluid Bleeding Low, dull backache unrelieved by Tylenol Intestinal cramps, with or without diarrhea Uterine contractions. May feel painless and feel like the uterus is tightening or the baby is "balling up" Pelvic pressure General feeling that something is not right  No restrictions on diet or activity    Return to [facility] on December 6th at 10:00 am for your second dose of steroids.

## 2015-06-11 NOTE — Final Progress Note (Addendum)
Physician Final Progress Note  Patient ID: Melinda NeedlesJada N Bridges MRN: [id number] DOB/AGE: 21/03/1996 21 y.o.  Admit date: 06/11/2015 Admitting provider: Vena AustriaAndreas Staebler, MD/ Farrel Connersolleen Shaquan Missey, CNM Discharge date: 06/11/2015   Admission Diagnoses: Pelvic pressure/ preterm dilation  Discharge Diagnoses:  Active Problems:   Pelvic pressure in pregnancy, antepartum  UTI Positive FFN Preterm dilation  Consults: None  Significant Findings/ Diagnostic Studies:  Results for orders placed or performed during the hospital encounter of 06/11/15 (from the past 24 hour(s))  Urinalysis complete, with microscopic ([facility] only)     Status: Abnormal   Collection Time: 06/11/15 12:19 PM  Result Value Ref Range   Color, Urine STRAW (A) YELLOW   APPearance HAZY (A) CLEAR   Glucose, UA NEGATIVE NEGATIVE mg/dL   Bilirubin Urine NEGATIVE NEGATIVE   Ketones, ur NEGATIVE NEGATIVE mg/dL   Specific Gravity, Urine 1.002 (L) 1.005 - 1.030   Hgb urine dipstick NEGATIVE NEGATIVE   pH 6.0 5.0 - 8.0   Protein, ur NEGATIVE NEGATIVE mg/dL   Nitrite NEGATIVE NEGATIVE   Leukocytes, UA 2+ (A) NEGATIVE   RBC / HPF 0-5 0 - 5 RBC/hpf   WBC, UA 6-30 0 - 5 WBC/hpf   Bacteria, UA NONE SEEN NONE SEEN   Squamous Epithelial / LPF 6-30 (A) NONE SEEN  Fetal fibronectin     Status: Abnormal   Collection Time: 06/11/15 12:19 PM  Result Value Ref Range   Fetal Fibronectin POSITIVE (A) NEGATIVE   Appearance, FETFIB CLEAR CLEAR  Chlamydia/NGC rt PCR ([facility] only)     Status: None   Collection Time: 06/11/15 12:20 PM  Result Value Ref Range   Specimen source GC/Chlam ENDOCERVICAL    Chlamydia Tr NOT DETECTED NOT DETECTED   N gonorrhoeae NOT DETECTED NOT DETECTED  Patient presented to office with pelvic pressure and was found to be dilated 1/30%/-3. She was sent to the L&D to evaluate for contractions. She was monitored for 3 hours and no uterine activity was noted. Cervical exam was essentially unchanged (FT/30%/ OOP). She received  BMZ x1 due to positive FFN and she was begun on Keflex for a possible UTI.  Procedures: NST was reactive. Betamethasone IM  Discharge Condition: stable  Disposition: 01-Home or Self Care. [facility]  as scheduled  Diet: Regular diet  Discharge Activity: No intercourse or prolonged standing or walking until FU appt  Discharge medications: Singulair 10 mgm daily, PNV, Albuterol, QVAR RX given for Keflex 500 mgm tid x 1 week.      SignedFarrel Conners: Neeka Urista 06/11/2015, 9:04 PM

## 2015-06-11 NOTE — Progress Notes (Signed)
L&D Triage Note   [age] year old G2 P0010 with EDC=09/07/2015 by LMP=10 wk ultrasound presented to the office at 27 3/7 weeks with c/o increased pelvic pressure since yesterday. She states the pressure is constant but then would increase intermittently and that increased pressure would last about 20 minutes. No vaginal bleeding, dysuria or unusual discharge. FFN collected in the office and patient brought specimen with her to L&D. Wet prep was also negative by Dr Jean RosenthalJackson. Cervical exam was 1/30%/-3. She presented to L&D for evaluation of possible preterm labor.  [facility] remarkable for asthma, headaches, syncopal episodes, and anemia.  Current medications: Singulair 10 mg daily, Albuterol nebulizer treatments, QVAR prn, PNV  Allergies: NKDA  Exam: General : in NAD BP 94/56 FHR :135 with accelerations to 150, moderate variability, no deceleration Toco: no contractions seen with toco, readjusted toco  Results for orders placed or performed during the hospital encounter of 06/11/15 (from the past 24 hour(s))  Urinalysis complete, with microscopic ([facility] only)     Status: Abnormal   Collection Time: 06/11/15 12:19 PM  Result Value Ref Range   Color, Urine STRAW (A) YELLOW   APPearance HAZY (A) CLEAR   Glucose, UA NEGATIVE NEGATIVE mg/dL   Bilirubin Urine NEGATIVE NEGATIVE   Ketones, ur NEGATIVE NEGATIVE mg/dL   Specific Gravity, Urine 1.002 (L) 1.005 - 1.030   Hgb urine dipstick NEGATIVE NEGATIVE   pH 6.0 5.0 - 8.0   Protein, ur NEGATIVE NEGATIVE mg/dL   Nitrite NEGATIVE NEGATIVE   Leukocytes, UA 2+ (A) NEGATIVE   RBC / HPF 0-5 0 - 5 RBC/hpf   WBC, UA 6-30 0 - 5 WBC/hpf   Bacteria, UA NONE SEEN NONE SEEN   Squamous Epithelial / LPF 6-30 (A) NONE SEEN  Fetal fibronectin     Status: Abnormal   Collection Time: 06/11/15 12:19 PM  Result Value Ref Range   Fetal Fibronectin POSITIVE (A) NEGATIVE   Appearance, FETFIB CLEAR CLEAR  Abdomen: soft, no contractions palpated Cervix:  1/30%/-3 in office  Ultrasound: breech presentation with posterior placenta  A: IUP at 27 3/7 weeks with possible UTI and positive FFN  P: GC/Chlamydia sent Urine culture BMZ Recheck cervix in an hour and continue to monitor for contractions Keflex for possible UTI Clear liquids  Geovanna Simko, CNM

## 2015-06-12 ENCOUNTER — Encounter: Payer: Self-pay | Admitting: Obstetrics and Gynecology

## 2015-06-12 ENCOUNTER — Observation Stay
Admission: RE | Admit: 2015-06-12 | Discharge: 2015-06-12 | Disposition: A | Discharge status: Home / Self Care | Payer: Medicaid Other | Attending: Obstetrics and Gynecology | Admitting: Obstetrics and Gynecology

## 2015-06-12 DIAGNOSIS — Z3A27 27 weeks gestation of pregnancy: Secondary | ICD-10-CM | POA: Insufficient documentation

## 2015-06-12 DIAGNOSIS — O26892 Other specified pregnancy related conditions, second trimester: Secondary | ICD-10-CM | POA: Diagnosis not present

## 2015-06-12 DIAGNOSIS — R102 Pelvic and perineal pain: Secondary | ICD-10-CM | POA: Insufficient documentation

## 2015-06-12 MED ORDER — BETAMETHASONE SOD PHOS & ACET 6 (3-3) MG/ML IJ SUSP
12.0000 mg | Freq: Once | INTRAMUSCULAR | Status: AC
  Administered 2015-06-12: 12 mg via INTRAMUSCULAR

## 2015-06-12 MED ORDER — BETAMETHASONE SOD PHOS & ACET 6 (3-3) MG/ML IJ SUSP
INTRAMUSCULAR | Status: AC
  Administered 2015-06-12: 12 mg via INTRAMUSCULAR
  Filled 2015-06-12: qty 1

## 2015-06-12 NOTE — Discharge Instructions (Signed)

## 2015-06-12 NOTE — Final Progress Note (Addendum)
Obstetrics Admission History & Physical  06/12/2015 - 3:22 PM Primary OBGYN: [facility]  Chief Complaint: BMZ #2  History of Present Illness  19 y.o. G2P0010 @ [redacted]w[redacted]d (Dating: EDC 3/3, LMP=10), with the above CC. Pregnancy complicated by: asthma, migraines.  Patient presented to office yesterday for pelvic pressure and was 1/30/-3. She was sent to L&D and had a +FFN (done after initial cx exam), equivocal U/A, neg GC/CT and cx felt to be FT but otherwise no change from the office. She was put on keflex with UCx pending and BMZ and set up for BMZ #2 today.  The patient states that pelvic pressure is unchanged from yesterday but no decreased FM, VB or LOF.  She states that pressure is low in her belly and comes and goes.  Review of Systems: as above  PMHx:  Past Medical History  Diagnosis Date  . Asthma   . Headache 19 years old  . Anemia affecting pregnancy in second trimester, antepartum    PSHx:  Past Surgical History  Procedure Laterality Date  . No past surgeries  2011       Medications: singulair, PNV, approx 1x/week need for albuterol and qvar  Allergies: has No Known Allergies. OBHx:  OB History  Gravida Para Term Preterm AB SAB TAB Ectopic Multiple Living  # Outcome Date GA Lbr Len/2nd Weight Sex Delivery Anes PTL Lv  2 Current           1 SAB 07/08/11             GYNHx:  History of STIs: No..             FHx: No family history on file. Soc Hx:  Social History   Social History  . Marital Status: Single    Spouse Name: N/A  . Number of Children: N/A  . Years of Education: N/A   Occupational History  . Not on file.   Social History Main Topics  . Smoking status: Former Games developer  . Smokeless tobacco: Never Used  . Alcohol Use: No  . Drug Use: No  . Sexual Activity: Yes   Other Topics Concern  . Not on file   Social History Narrative    Objective    Current Vital Signs 24h Vital Sign Ranges  T 99.1 F (37.3 C) Temp  Avg: 99.1 F (37.3  C)  Min: 99.1 F (37.3 C)  Max: 99.1 F (37.3 C)  BP 104/69 mmHg BP  Min: 104/69  Max: 104/69  HR 99 Pulse  Avg: 99  Min: 99  Max: 99  RR 16 Resp  Avg: 16  Min: 16  Max: 16  SaO2    97/RA No Data Recorded       24 Hour I/O Current Shift I/O  Time Ins Outs       EFM: 140 baseline, ?accels, one slight variable, mod variability  Toco: +irritability, no definite UCs  General: Well nourished, well developed female in no acute distress.  Skin:  Warm and dry.  Cardiovascular: Regular rate and rhythm. Respiratory:  Clear to auscultation bilateral. Normal respiratory effort Abdomen: nttp, gravid Neuro/Psych:  Normal mood and affect.   SSE: deferred SVE: FT/long/high  Labs  UCx pending  Radiology BSUS (BPD/HC/AC/FL): transverse, AFI 23, EFW 1110gm, normal FHR  Assessment & Plan   19 y.o. G2P0010 @ [redacted]w[redacted]d with possible preterm cervical dilation. Currently stable I told her that she  could or could not be in PTL but +FFN is 50/50 for predictive value and that if she was, now that she's gotten BMZ, we wouldn't tocolyze, but based on exam and EFM, I don't believe that she's in PTL. EFW reassuring but will bring her back to the office later this week for formal growth u/s. She states that the pain is 8/10 but talking and texting during exam and interview and I told her that if s/s worsen to let us know and come back for evaluation. Pt and mom are amenable to plan   Cornelia Copaharlie Jawanna Dykman, Jr. MD [facility] Pager [phone]

## 2015-06-12 NOTE — OB Triage Note (Signed)
Melinda Barnett here for second betamethasone injection. States she has had some tightening in her abdomen, has not timed it. Positive fetal movement, denies bleeding, LOF.

## 2015-06-13 LAB — URINE CULTURE

## 2015-07-08 NOTE — L&D Delivery Note (Signed)
Delivery Note At 7:21 PM a viable female was delivered via Vaginal, Spontaneous Delivery (Presentation: Right Occiput Anterior).  APGAR: 8, 9; weight 6 lb 4 oz (2835 g).   Placenta status: Intact, Spontaneous.  Cord: 3 vessels with the following complications: Knot.  Cord pH: NA  Called to see patient.  Moderate meconium stained fluid noted with rupture of membranes. Mom pushed to delivery viable female infant.  The head followed by shoulders, which delivered without difficulty, and the rest of the body.  No nuchal cord noted.  Vigorous baby girl to mom's chest.  Cord clamped and cut after > 3 min delay.  No cord blood obtained.  Placenta delivered spontaneously, intact, with a 3-vessel cord.  Perineum intact.  All counts correct.  Hemostasis obtained with IV pitocin and fundal massage. EBL 250 mL.    Anesthesia: Epidural  Episiotomy: None Lacerations: None  Suture Repair: NA Est. Blood Loss (mL):  250  Mom to postpartum.  Baby to Couplet care / Skin to Skin.  Tresea MallGLEDHILL,Margarette Vannatter, CNM  Dr Jean RosenthalJackson was present for observation/assistance with the delivery.

## 2015-07-10 ENCOUNTER — Observation Stay
Admission: EM | Admit: 2015-07-10 | Discharge: 2015-07-11 | Disposition: A | Discharge status: Home / Self Care | Payer: Medicaid Other | Attending: Obstetrics and Gynecology | Admitting: Obstetrics and Gynecology

## 2015-07-10 DIAGNOSIS — Z3A32 32 weeks gestation of pregnancy: Secondary | ICD-10-CM | POA: Insufficient documentation

## 2015-07-10 DIAGNOSIS — R109 Unspecified abdominal pain: Secondary | ICD-10-CM | POA: Diagnosis present

## 2015-07-10 DIAGNOSIS — O26893 Other specified pregnancy related conditions, third trimester: Secondary | ICD-10-CM | POA: Diagnosis not present

## 2015-07-10 LAB — WET PREP, GENITAL
SPERM: NONE SEEN
Trich, Wet Prep: NONE SEEN

## 2015-07-10 LAB — URINALYSIS COMPLETE WITH MICROSCOPIC (ARMC ONLY)
BACTERIA UA: NONE SEEN
BILIRUBIN URINE: NEGATIVE
GLUCOSE, UA: NEGATIVE mg/dL
KETONES UR: NEGATIVE mg/dL
NITRITE: NEGATIVE
Protein, ur: NEGATIVE mg/dL
SPECIFIC GRAVITY, URINE: 1.009 (ref 1.005–1.030)
pH: 7 (ref 5.0–8.0)

## 2015-07-10 LAB — CHLAMYDIA/NGC RT PCR (ARMC ONLY)
CHLAMYDIA TR: NOT DETECTED
N GONORRHOEAE: NOT DETECTED

## 2015-07-10 NOTE — OB Triage Note (Signed)
Abdominal pain rating 10/10 worse today around noon. Previous betamethasone. No vaginal bleeding or leaking fluid.

## 2015-07-10 NOTE — Final Progress Note (Signed)
Physician Final Progress Note  Patient ID: Melinda Barnett MRN: [id number] DOB/AGE: 1995/09/09 19 y.o.  Admit date: 07/10/2015 Admitting provider: Conard Novak, MD Discharge date: 07/10/2015   Admission Diagnoses:  1) intrauterine pregnancy at [redacted]w[redacted]d  2) contractions  Discharge Diagnoses:  1) intrauterine pregnancy at [redacted]w[redacted]d  2) contractions  History of Present Illness: The patient is a 20 y.o. female G2P0010 at [redacted]w[redacted]d who presents for abdominal contractions since 1230 pm this afternoon. She denies vaginal bleeding and leakage of fluid. She notes +FM.  The contractions initially were coming every 30 minutes, then were coming every 10 minutes.  So, she presented to the hospital. She denies urinary, vaginal, and GI symptoms. She denies fevers and chills.  She has a history in this pregnancy of preterm dilation to about 1cm approximately 1 month ago for which she received betamethasone.     Past Medical History  Diagnosis Date  . Asthma   . Headache 20 years old  . Anemia affecting pregnancy in second trimester, antepartum    Past Surgical History: No past surgical history on file.  No current facility-administered medications on file prior to encounter.   Current Outpatient Prescriptions on File Prior to Encounter  Medication Sig Dispense Refill  . albuterol (PROVENTIL HFA;VENTOLIN HFA) 108 (90 BASE) MCG/ACT inhaler Inhale 2 puffs into the lungs every 6 (six) hours as needed for wheezing or shortness of breath.    . montelukast (SINGULAIR) 10 MG tablet Take 10 mg by mouth at bedtime.    . Prenatal Vit-Fe Fumarate-FA (PRENATAL MULTIVITAMIN) TABS tablet Take 1 tablet by mouth daily at 12 noon.     Allergies: No Known Allergies  Social History   Social History  . Marital Status: Single    Spouse Name: N/A  . Number of Children: N/A  . Years of Education: N/A   Occupational History  . Not on file.   Social History Main Topics  . Smoking status: Former Games developer  . Smokeless  tobacco: Never Used  . Alcohol Use: No  . Drug Use: No  . Sexual Activity: Yes   Other Topics Concern  . Not on file   Social History Narrative    Physical Exam: LMP 12/01/2014  Gen: NAD CV: RRR Pulm: CTAB Pelvic: NEFG, vagina is pink and normally rugated. There is a foamy white discharge that is not malodorous.  No CMT, no cervical bleeding. Cervix is 1/30//high (no change from 1 month ago) Ext: no e/c/t  Consults: None  Significant Findings/ Diagnostic Studies: Lab Results  Component Value Date   APPEARANCEUR CLOUDY* 07/10/2015   GLUCOSEU NEGATIVE 07/10/2015   BILIRUBINUR NEGATIVE 07/10/2015   KETONESUR NEGATIVE 07/10/2015   LABSPEC 1.009 07/10/2015   HGBUR 1+* 07/10/2015   PHURINE 7.0 07/10/2015   NITRITE NEGATIVE 07/10/2015   LEUKOCYTESUR 3+* 07/10/2015   RBCU TOO NUMEROUS TO COUNT 07/10/2015   WBCU TOO NUMEROUS TO COUNT 07/10/2015   BACTERIA NONE SEEN 07/10/2015   EPIU TOO NUMEROUS TO COUNT* 07/10/2015   MUCOUSUACOMP PRESENT 01/25/2015   Lab Results  Component Value Date   TRICHWETPREP NONE SEEN 07/10/2015   CLUECELLS FEW* 07/10/2015   WBCWETPREP MANY* 07/10/2015   YEASTWETPREP FEW* 07/10/2015   Lab Results  Component Value Date   CHLAMYDIA NOT DETECTED 06/11/2015   NGONORRHOEAE NOT DETECTED 06/11/2015   Procedures: NST Baseline: 135 bpm Variability: moderate Accels: positive Decels: negative  Discharge Condition: stable  Disposition: 01-Home or Self Care  Diet: Regular diet  Discharge Activity: Activity as tolerated  Medication List    ASK your doctor about these medications        albuterol 108 (90 Base) MCG/ACT inhaler  Commonly known as:  PROVENTIL HFA;VENTOLIN HFA  Inhale 2 puffs into the lungs every 6 (six) hours as needed for wheezing or shortness of breath.     montelukast 10 MG tablet  Commonly known as:  SINGULAIR  Take 10 mg by mouth at bedtime.     prenatal multivitamin Tabs tablet  Take 1 tablet by mouth daily at 12  noon.       Total time spent taking care of this patient: 45 minutes  Signed: Conard NovakJackson, Lakira Ogando D, MD  07/10/2015, 11:29 PM

## 2015-07-11 DIAGNOSIS — O26893 Other specified pregnancy related conditions, third trimester: Secondary | ICD-10-CM | POA: Diagnosis not present

## 2015-07-11 NOTE — Discharge Instructions (Signed)
Rest and hydrate. Call your doctor or return to the Emergency Department if you have any questions or concerns.

## 2015-08-08 ENCOUNTER — Observation Stay
Admission: EM | Admit: 2015-08-08 | Discharge: 2015-08-08 | Disposition: A | Discharge status: Home / Self Care | Payer: Medicaid Other | Attending: Certified Nurse Midwife | Admitting: Certified Nurse Midwife

## 2015-08-08 DIAGNOSIS — G43909 Migraine, unspecified, not intractable, without status migrainosus: Secondary | ICD-10-CM | POA: Diagnosis not present

## 2015-08-08 DIAGNOSIS — B373 Candidiasis of vulva and vagina: Secondary | ICD-10-CM | POA: Diagnosis not present

## 2015-08-08 DIAGNOSIS — R55 Syncope and collapse: Secondary | ICD-10-CM | POA: Insufficient documentation

## 2015-08-08 DIAGNOSIS — O99513 Diseases of the respiratory system complicating pregnancy, third trimester: Secondary | ICD-10-CM | POA: Diagnosis not present

## 2015-08-08 DIAGNOSIS — B9689 Other specified bacterial agents as the cause of diseases classified elsewhere: Secondary | ICD-10-CM | POA: Diagnosis not present

## 2015-08-08 DIAGNOSIS — O23593 Infection of other part of genital tract in pregnancy, third trimester: Secondary | ICD-10-CM | POA: Insufficient documentation

## 2015-08-08 DIAGNOSIS — O99353 Diseases of the nervous system complicating pregnancy, third trimester: Secondary | ICD-10-CM | POA: Diagnosis not present

## 2015-08-08 DIAGNOSIS — Z3A35 35 weeks gestation of pregnancy: Secondary | ICD-10-CM | POA: Insufficient documentation

## 2015-08-08 DIAGNOSIS — O98813 Other maternal infectious and parasitic diseases complicating pregnancy, third trimester: Secondary | ICD-10-CM | POA: Diagnosis not present

## 2015-08-08 DIAGNOSIS — J45909 Unspecified asthma, uncomplicated: Secondary | ICD-10-CM | POA: Diagnosis not present

## 2015-08-08 LAB — OB RESULTS CONSOLE GBS: STREP GROUP B AG: POSITIVE

## 2015-08-08 LAB — URINALYSIS COMPLETE WITH MICROSCOPIC (ARMC ONLY)
BILIRUBIN URINE: NEGATIVE
GLUCOSE, UA: NEGATIVE mg/dL
Ketones, ur: NEGATIVE mg/dL
NITRITE: NEGATIVE
PH: 7 (ref 5.0–8.0)
Protein, ur: NEGATIVE mg/dL
SPECIFIC GRAVITY, URINE: 1.008 (ref 1.005–1.030)

## 2015-08-08 NOTE — Final Progress Note (Signed)
Physician Final Progress Note  Patient ID: Melinda Barnett MRN: [id number] DOB/AGE: January 27, 1996 19 y.o.  Admit date: 08/08/2015 Admitting provider: Nadara Mustard, MD/ Farrel Conners, CNM Discharge date: 08/08/2015   Admission Diagnoses: IUP at 35.5 weeks Pelvic pain  Discharge Diagnoses:  IUP at 35.5 weeks with preterm contractions Pelvic pain Pyruria Bacterial vaginosis Monilial vulvovaginitis  Consults: NONE  Significant Findings/ Diagnostic Studies: 20 year old G2 P0010 with EDC=09/07/2015 by LMP=12/01/2014 and 10 wk ultrasound presented at 35.5 weeks with complaints of pelvic pain and pressure and a feeling of having to push. Was having some irregular contractions, but mostly uterine irritability. FHR baseline was 130 with accelerations to 140s to 150s. She denied vaginal bleeding, leakage of fluid, increased vaginal discharge, or vulvar irritation. Had just been treated for a UTI one month ago when she presented with similar symptoms. Cervix was still 1 cm dilated (has been 1 cm since 27 weeks-received BMZ)/ 50%/-3 and ballotable. Cephalic on Leopolds. Urinalysis returned with +3 leukocytes (TNTC WBC), 6-30 RBC, negative protein and nitrites, and few bacteria. Wet prep on white discharge was positive for a few clue cells and hyphae, negative for Trichimonas.  Urine culture and GBS culture were ordered. Patient was discharged home on clindamycin 300 mgm BID x7d and Diflucan 150 mgm q3d x 2doses. [facility]atal care at [facility] complicated by asthma, migraine headaches, syncopal episodes associated with migraines and Trichimoniasis.  Procedures: NST-reactive  Discharge Condition: stable  Disposition: 01-Home or Self Care  Diet: Regular diet  Discharge Activity: Activity as tolerated     Medication List    ASK your doctor about these medications        albuterol 108 (90 Base) MCG/ACT inhaler  Commonly known as:  PROVENTIL HFA;VENTOLIN HFA  Inhale 2 puffs into the lungs every 6 (six)  hours as needed for wheezing or shortness of breath.     montelukast 10 MG tablet  Commonly known as:  SINGULAIR  Take 10 mg by mouth at bedtime.     prenatal multivitamin Tabs tablet  Take 1 tablet by mouth daily at 12 noon.           Follow-up Information    Follow up with [facility].   Why:  Continue with regularly scheduled prenatal care    Follow up on 08/14/2015 Total time spent taking care of this patient: 25 minutes  Signed: Farrel Conners 08/08/2015, 10:15 PM

## 2015-08-08 NOTE — OB Triage Note (Signed)
Rcvd to OBS 3 for pelvic pain and pressure.  Changed to gown and to bed.  EFM applied.  Plan of care discussed and oriented to room.

## 2015-08-10 LAB — URINE CULTURE

## 2015-08-10 LAB — CULTURE, BETA STREP (GROUP B ONLY)

## 2015-08-25 ENCOUNTER — Observation Stay
Admission: EM | Admit: 2015-08-25 | Discharge: 2015-08-25 | Disposition: A | Discharge status: Home / Self Care | Payer: Medicaid Other | Attending: Obstetrics & Gynecology | Admitting: Obstetrics & Gynecology

## 2015-08-25 DIAGNOSIS — R109 Unspecified abdominal pain: Secondary | ICD-10-CM

## 2015-08-25 DIAGNOSIS — O26899 Other specified pregnancy related conditions, unspecified trimester: Secondary | ICD-10-CM

## 2015-08-25 DIAGNOSIS — Z3A39 39 weeks gestation of pregnancy: Secondary | ICD-10-CM | POA: Diagnosis not present

## 2015-08-25 NOTE — OB Triage Note (Signed)
Pt reports to triage today for abdominal pain/ctx that started this morning around midnight.  She states that they would occur on and off for 20 minutes and then stop.  Pt denies LOF and reports one quarter sized clot expelled around midnight when the pains started.  No more bleeding since that time.  Pt reports that she has not noticed her baby moving since midnight when the pain started.  Mild ctx palpated.

## 2015-08-25 NOTE — OB Triage Note (Signed)
Pt reporting that ctx have spaced out since being here and that baby is moving more.  Positive fetal mov't palpated x2 when palpating maternal abdomen.  SVE remains unchanged.

## 2015-08-25 NOTE — Final Progress Note (Signed)
Physician Final Progress Note  Patient ID: Melinda Barnett MRN: [id number] DOB/AGE: 1996-03-22 19 y.o.  Admit date: 08/25/2015 Admitting provider: Nadara Mustard, MD Discharge date: 08/25/2015   Admission Diagnoses: abd pain  Discharge Diagnoses:  Active Problems:   * No active hospital problems. *  same  Procedures: A NST procedure was performed with FHR monitoring and a normal baseline established, appropriate time of 20-40 minutes of evaluation, and accels >2 seen w 15x15 characteristics.  Results show a REACTIVE NST.    Discharge Condition: stable,  not in labor  Disposition: home  Discharge Activity: walk     Medication List    ASK your doctor about these medications        albuterol 108 (90 Base) MCG/ACT inhaler  Commonly known as:  PROVENTIL HFA;VENTOLIN HFA  Inhale 2 puffs into the lungs every 6 (six) hours as needed for wheezing or shortness of breath.     montelukast 10 MG tablet  Commonly known as:  SINGULAIR  Take 10 mg by mouth at bedtime.     prenatal multivitamin Tabs tablet  Take 1 tablet by mouth daily at 12 noon.       triage visit  Signed: Letitia Libra 08/25/2015, 1:36 PM

## 2015-08-27 ENCOUNTER — Observation Stay
Admission: EM | Admit: 2015-08-27 | Discharge: 2015-08-27 | Disposition: A | Discharge status: Home / Self Care | Payer: Medicaid Other | Attending: Certified Nurse Midwife | Admitting: Certified Nurse Midwife

## 2015-08-27 DIAGNOSIS — O99513 Diseases of the respiratory system complicating pregnancy, third trimester: Secondary | ICD-10-CM | POA: Diagnosis not present

## 2015-08-27 DIAGNOSIS — D649 Anemia, unspecified: Secondary | ICD-10-CM | POA: Diagnosis not present

## 2015-08-27 DIAGNOSIS — Z3A38 38 weeks gestation of pregnancy: Secondary | ICD-10-CM | POA: Insufficient documentation

## 2015-08-27 DIAGNOSIS — J45909 Unspecified asthma, uncomplicated: Secondary | ICD-10-CM | POA: Insufficient documentation

## 2015-08-27 DIAGNOSIS — O99013 Anemia complicating pregnancy, third trimester: Secondary | ICD-10-CM | POA: Diagnosis not present

## 2015-08-27 NOTE — Final Progress Note (Addendum)
Physician Final Progress Note  Patient ID: Melinda Barnett MRN: [id number] DOB/AGE: 02-05-1996 19 y.o.  Admit date: 08/27/2015 Admitting provider: Vena Austria, MD Discharge date: 08/27/2015   Admission Diagnoses: Contractions, R/O labor  Discharge Diagnoses: Prodromal vs false labor  Consults: none  Significant Findings/ Diagnostic Studies: 20 year old G2 P0010 with EDC=09/07/2015 presented at 38.3 weeks contracting every 10 minutes since 9AM. Had a little leakage of fluid after urinating. Nitrazine negative.  Cervix 3.5-4/60%/-1 to -2 and unchanged after 2 hours of observation and ambulation. Contractions mild and irregular, mostly uterine irritability. Cephalic presentation on Leopold's. PNC at [facility] remarkable for asthma, migraines, Trichimoniasis, GBS UTI, anemia and PTL (1cm in early December-received BMZ).   Procedures: NST reactive with baseline 130s with accelerations to 150s to 160s, moderate variability  Discharge Condition: stable  Disposition: 01-Home or Self Care  Diet: Regular diet  Discharge Activity: Activity as tolerated     Medication List    ASK your doctor about these medications        albuterol 108 (90 Base) MCG/ACT inhaler  Commonly known as:  PROVENTIL HFA;VENTOLIN HFA  Inhale 2 puffs into the lungs every 6 (six) hours as needed for wheezing or shortness of breath.     montelukast 10 MG tablet  Commonly known as:  SINGULAIR  Take 10 mg by mouth at bedtime.     prenatal multivitamin Tabs tablet  Take 1 tablet by mouth daily at 12 noon.           Follow-up Information    Follow up with [facility].   Why:  As needed, If symptoms worsen, Routine OB appointment     FU at [facility] for ROB on 2/22 at 1500 Labor precautions Total time spent taking care of this patient: 15 minutes  Signed: Farrel Conners 08/27/2015, 3:27 PM

## 2015-08-27 NOTE — Discharge Instructions (Signed)
Please drink plenty of water and get plenty of rest. Please return to emergency room if symptoms worsen, contractions are 3-5 minutes apart for 1-2 hours, if your water breaks, or if you start bleeding like a period.

## 2015-08-27 NOTE — OB Triage Note (Signed)
Pt arrived to OBS rm 4 with c/o contractions every 10 mins starting 0900 increasing in strength. Pt placed on monitors and oriented to room

## 2015-09-01 ENCOUNTER — Observation Stay
Admit: 2015-09-01 | Discharge: 2015-09-01 | Disposition: A | Discharge status: Home / Self Care | Payer: Medicaid Other | Attending: Obstetrics and Gynecology | Admitting: Obstetrics and Gynecology

## 2015-09-01 DIAGNOSIS — O479 False labor, unspecified: Secondary | ICD-10-CM | POA: Diagnosis present

## 2015-09-01 DIAGNOSIS — Z3A39 39 weeks gestation of pregnancy: Secondary | ICD-10-CM | POA: Diagnosis not present

## 2015-09-01 NOTE — Final Progress Note (Signed)
Physician Final Progress Note  Patient ID: Melinda Barnett MRN: [id number] DOB/AGE: [birth year] 20 y.o.  Admit date: 09/01/2015 Admitting provider: [place] Bing, MD Discharge date: 09/01/2015   Admission Diagnoses: IUP at 39 1/7 Irregular uterine contractions  Discharge Diagnoses:  Active Problems:   Irregular uterine contractions  IUP at 39 1/7  Procedures:  NST: Baseline 130 BPM, Moderate variability, + accelerations, - decelerations TOCO: Irregular contractions  Triage Course: SVE unchanged over two hours at 4/70/-1 to -2  Discharge Condition: good  Disposition: 01-Home or Self Care  Diet: Regular diet  Discharge Activity: Activity as tolerated      Discharge Instructions    Discharge activity:  No Restrictions    Complete by:  As directed      Discharge diet:  No restrictions    Complete by:  As directed      Fetal Kick Count:  Lie on our left side for one hour after a meal, and count the number of times your baby kicks.  If it is less than 5 times, get up, move around and drink some juice.  Repeat the test 30 minutes later.  If it is still less than 5 kicks in an hour, notify your doctor.    Complete by:  As directed      LABOR:  When conractions begin, you should start to time them from the beginning of one contraction to the beginning  of the next.  When contractions are 5 - 10 minutes apart or less and have been regular for at least an hour, you should call your health care provider.    Complete by:  As directed      No sexual activity restrictions    Complete by:  As directed      Notify physician for bleeding from the vagina    Complete by:  As directed      Notify physician for blurring of vision or spots before the eyes    Complete by:  As directed      Notify physician for chills or fever    Complete by:  As directed      Notify physician for fainting spells, "black outs" or loss of consciousness    Complete by:  As directed      Notify physician for  increase in vaginal discharge    Complete by:  As directed      Notify physician for leaking of fluid    Complete by:  As directed      Notify physician for pain or burning when urinating    Complete by:  As directed      Notify physician for pelvic pressure (sudden increase)    Complete by:  As directed      Notify physician for severe or continued nausea or vomiting    Complete by:  As directed      Notify physician for sudden gushing of fluid from the vagina (with or without continued leaking)    Complete by:  As directed      Notify physician for sudden, constant, or occasional abdominal pain    Complete by:  As directed      Notify physician if baby moving less than usual    Complete by:  As directed             Medication List    TAKE these medications        albuterol 108 (90 Base) MCG/ACT inhaler  Commonly known as:  PROVENTIL  HFA;VENTOLIN HFA  Inhale 2 puffs into the lungs every 6 (six) hours as needed for wheezing or shortness of breath.     montelukast 10 MG tablet  Commonly known as:  SINGULAIR  Take 10 mg by mouth at bedtime.     prenatal multivitamin Tabs tablet  Take 1 tablet by mouth daily at 12 noon.       Follow-up Information    Go on 09/04/2015 to follow up.   Why:  routine OB care      Follow up with Lorrene Reid, MD In 3 days.   Specialty:  Obstetrics and Gynecology   Contact information:   [address] [place] [address] [phone]      1. Labor precautions given to patient 2. Tylenol PM for rest  Total time spent taking care of this patient: 15 minutes  Signed: Tresea Mall, CNM  This patient and plan were discussed with Dr Vergie Living 09/01/2015

## 2015-09-07 ENCOUNTER — Observation Stay
Admission: RE | Admit: 2015-09-07 | Discharge: 2015-09-07 | Disposition: A | Discharge status: Home / Self Care | Payer: Medicaid Other | Source: Other Acute Inpatient Hospital | Attending: Obstetrics and Gynecology | Admitting: Obstetrics and Gynecology

## 2015-09-07 ENCOUNTER — Inpatient Hospital Stay: Admission: RE | Admit: 2015-09-07 | Payer: Medicaid Other | Admitting: Obstetrics and Gynecology

## 2015-09-07 DIAGNOSIS — O368130 Decreased fetal movements, third trimester, not applicable or unspecified: Principal | ICD-10-CM | POA: Insufficient documentation

## 2015-09-07 DIAGNOSIS — Z3A40 40 weeks gestation of pregnancy: Secondary | ICD-10-CM | POA: Diagnosis not present

## 2015-09-07 DIAGNOSIS — O368190 Decreased fetal movements, unspecified trimester, not applicable or unspecified: Secondary | ICD-10-CM | POA: Diagnosis present

## 2015-09-07 NOTE — Final Progress Note (Signed)
Physician Final Progress Note  Patient ID: Melinda Barnett MRN: [id number] DOB/AGE: 22-12-1995 22 y.o.  Admit date: 09/07/2015 Admitting provider: Bruno Bing, MD Discharge date: 09/07/2015   Admission Diagnoses: Decreased Fetal Movement  Discharge Diagnoses:  Active Problems: IUP at 40 weeks Reactive NST       Procedures: NST: Baseline 135 bpm, moderate variability, + accelerations, - decelerations TOCO: no contractions Cervix: 4/70/-3 Posterior  Discharge Condition: good  Disposition: 01-Home or Self Care  Diet: Regular diet  Discharge Activity: Activity as tolerated      Discharge Instructions    Discharge activity:  No Restrictions    Complete by:  As directed      Discharge diet:  No restrictions    Complete by:  As directed      Fetal Kick Count:  Lie on our left side for one hour after a meal, and count the number of times your baby kicks.  If it is less than 5 times, get up, move around and drink some juice.  Repeat the test 30 minutes later.  If it is still less than 5 kicks in an hour, notify your doctor.    Complete by:  As directed      LABOR:  When conractions begin, you should start to time them from the beginning of one contraction to the beginning  of the next.  When contractions are 5 - 10 minutes apart or less and have been regular for at least an hour, you should call your health care provider.    Complete by:  As directed      No sexual activity restrictions    Complete by:  As directed      Notify physician for bleeding from the vagina    Complete by:  As directed      Notify physician for blurring of vision or spots before the eyes    Complete by:  As directed      Notify physician for chills or fever    Complete by:  As directed      Notify physician for fainting spells, "black outs" or loss of consciousness    Complete by:  As directed      Notify physician for increase in vaginal discharge    Complete by:  As directed      Notify physician  for leaking of fluid    Complete by:  As directed      Notify physician for pain or burning when urinating    Complete by:  As directed      Notify physician for pelvic pressure (sudden increase)    Complete by:  As directed      Notify physician for severe or continued nausea or vomiting    Complete by:  As directed      Notify physician for sudden gushing of fluid from the vagina (with or without continued leaking)    Complete by:  As directed      Notify physician for sudden, constant, or occasional abdominal pain    Complete by:  As directed      Notify physician if baby moving less than usual    Complete by:  As directed             Medication List    TAKE these medications        albuterol 108 (90 Base) MCG/ACT inhaler  Commonly known as:  PROVENTIL HFA;VENTOLIN HFA  Inhale 2 puffs into the lungs every 6 (six) hours  as needed for wheezing or shortness of breath.     montelukast 10 MG tablet  Commonly known as:  SINGULAIR  Take 10 mg by mouth at bedtime.     prenatal multivitamin Tabs tablet  Take 1 tablet by mouth daily at 12 noon.       Follow-up Information    Go to to follow up.   Why:  regular scheduled appointment on 09/10/15     Post Dates IOL scheduled for AM on 09/13/15 Total time spent taking care of this patient: 20 minutes  Signed: Tresea MallGLEDHILL,Alethia Melendrez, CNM  This patient and plan were discussed with Dr Vergie LivingPickens 09/07/2015

## 2015-09-07 NOTE — Discharge Summary (Signed)
Discharge instructions reviewed with patient including follow up appointments, scheduled induction of labor for September 13, 2015 at 0800, fetal kick counts, labor precautions, and when to seek medical attention. All questions answered. Patient discharged home in stable condition, ambulatory with steady gait.

## 2015-09-10 ENCOUNTER — Inpatient Hospital Stay
Admission: EM | Admit: 2015-09-10 | Discharge: 2015-09-12 | DRG: 775 | Disposition: A | Discharge status: Home / Self Care | Payer: Medicaid Other | Attending: Obstetrics and Gynecology | Admitting: Obstetrics and Gynecology

## 2015-09-10 ENCOUNTER — Inpatient Hospital Stay: Payer: Medicaid Other | Admitting: Registered Nurse

## 2015-09-10 ENCOUNTER — Encounter: Payer: Self-pay | Admitting: *Deleted

## 2015-09-10 DIAGNOSIS — J45909 Unspecified asthma, uncomplicated: Secondary | ICD-10-CM | POA: Diagnosis present

## 2015-09-10 DIAGNOSIS — O480 Post-term pregnancy: Secondary | ICD-10-CM | POA: Diagnosis present

## 2015-09-10 DIAGNOSIS — Z3A40 40 weeks gestation of pregnancy: Secondary | ICD-10-CM

## 2015-09-10 DIAGNOSIS — Z823 Family history of stroke: Secondary | ICD-10-CM

## 2015-09-10 DIAGNOSIS — O9952 Diseases of the respiratory system complicating childbirth: Secondary | ICD-10-CM | POA: Diagnosis present

## 2015-09-10 DIAGNOSIS — Z87891 Personal history of nicotine dependence: Secondary | ICD-10-CM | POA: Diagnosis not present

## 2015-09-10 DIAGNOSIS — O99824 Streptococcus B carrier state complicating childbirth: Secondary | ICD-10-CM | POA: Diagnosis present

## 2015-09-10 DIAGNOSIS — Z23 Encounter for immunization: Secondary | ICD-10-CM | POA: Diagnosis not present

## 2015-09-10 LAB — CBC
HCT: 34 % — ABNORMAL LOW (ref 35.0–47.0)
Hemoglobin: 11.2 g/dL — ABNORMAL LOW (ref 12.0–16.0)
MCH: 27.1 pg (ref 26.0–34.0)
MCHC: 32.8 g/dL (ref 32.0–36.0)
MCV: 82.8 fL (ref 80.0–100.0)
PLATELETS: 230 10*3/uL (ref 150–440)
RBC: 4.11 MIL/uL (ref 3.80–5.20)
RDW: 15.1 % — AB (ref 11.5–14.5)
WBC: 9.1 10*3/uL (ref 3.6–11.0)

## 2015-09-10 LAB — ABO/RH: ABO/RH(D): B POS

## 2015-09-10 LAB — TYPE AND SCREEN
ABO/RH(D): B POS
ANTIBODY SCREEN: NEGATIVE

## 2015-09-10 MED ORDER — OXYCODONE-ACETAMINOPHEN 5-325 MG PO TABS: 1.0000 | ORAL_TABLET | ORAL | Status: DC | PRN

## 2015-09-10 MED ORDER — BUTORPHANOL TARTRATE 1 MG/ML IJ SOLN
1.0000 mg | INTRAMUSCULAR | Status: DC | PRN
Start: 2015-09-10 — End: 2015-09-11

## 2015-09-10 MED ORDER — NALOXONE HCL 0.4 MG/ML IJ SOLN: 0.4000 mg | INTRAMUSCULAR | Status: DC | PRN

## 2015-09-10 MED ORDER — FENTANYL 2.5 MCG/ML W/ROPIVACAINE 0.2% IN NS 100 ML EPIDURAL INFUSION (ARMC-ANES)
10.0000 mL/h | EPIDURAL | Status: DC
  Administered 2015-09-10: 10 mL/h via EPIDURAL

## 2015-09-10 MED ORDER — OXYTOCIN BOLUS FROM INFUSION
500.0000 mL | INTRAVENOUS | Status: DC
  Administered 2015-09-10: 500 mL via INTRAVENOUS

## 2015-09-10 MED ORDER — ONDANSETRON HCL 4 MG/2ML IJ SOLN: 4.0000 mg | Freq: Four times a day (QID) | INTRAMUSCULAR | Status: DC | PRN

## 2015-09-10 MED ORDER — OXYCODONE-ACETAMINOPHEN 5-325 MG PO TABS: 2.0000 | ORAL_TABLET | ORAL | Status: DC | PRN

## 2015-09-10 MED ORDER — NALBUPHINE HCL 10 MG/ML IJ SOLN: 5.0000 mg | Freq: Once | INTRAMUSCULAR | Status: DC | PRN

## 2015-09-10 MED ORDER — PENICILLIN G POTASSIUM 5000000 UNITS IJ SOLR
5.0000 10*6.[IU] | Freq: Once | INTRAVENOUS | Status: AC
  Administered 2015-09-10: 5 10*6.[IU] via INTRAVENOUS
  Filled 2015-09-10: qty 5

## 2015-09-10 MED ORDER — FENTANYL 2.5 MCG/ML W/ROPIVACAINE 0.2% IN NS 100 ML EPIDURAL INFUSION (ARMC-ANES)
EPIDURAL | Status: AC
  Filled 2015-09-10: qty 100

## 2015-09-10 MED ORDER — LIDOCAINE-EPINEPHRINE (PF) 1.5 %-1:200000 IJ SOLN
INTRAMUSCULAR | Status: DC | PRN
  Administered 2015-09-10: 3 mL via EPIDURAL

## 2015-09-10 MED ORDER — LACTATED RINGERS IV SOLN: 500.0000 mL | INTRAVENOUS | Status: DC | PRN

## 2015-09-10 MED ORDER — LIDOCAINE HCL (PF) 1 % IJ SOLN: 30.0000 mL | INTRAMUSCULAR | Status: DC | PRN

## 2015-09-10 MED ORDER — DIPHENHYDRAMINE HCL 25 MG PO CAPS: 25.0000 mg | ORAL_CAPSULE | ORAL | Status: DC | PRN

## 2015-09-10 MED ORDER — NALBUPHINE HCL 10 MG/ML IJ SOLN: 5.0000 mg | INTRAMUSCULAR | Status: DC | PRN

## 2015-09-10 MED ORDER — DIPHENHYDRAMINE HCL 50 MG/ML IJ SOLN: 12.5000 mg | INTRAMUSCULAR | Status: DC | PRN

## 2015-09-10 MED ORDER — MEPERIDINE HCL 25 MG/ML IJ SOLN: 6.2500 mg | INTRAMUSCULAR | Status: DC | PRN

## 2015-09-10 MED ORDER — KETOROLAC TROMETHAMINE 30 MG/ML IJ SOLN: 30.0000 mg | Freq: Four times a day (QID) | INTRAMUSCULAR | Status: DC | PRN

## 2015-09-10 MED ORDER — BUPIVACAINE HCL (PF) 0.25 % IJ SOLN
INTRAMUSCULAR | Status: DC | PRN
  Administered 2015-09-10 (×2): 4 mL via EPIDURAL

## 2015-09-10 MED ORDER — SODIUM CHLORIDE 0.9% FLUSH: 3.0000 mL | INTRAVENOUS | Status: DC | PRN

## 2015-09-10 MED ORDER — NALOXONE HCL 2 MG/2ML IJ SOSY
1.0000 ug/kg/h | PREFILLED_SYRINGE | INTRAVENOUS | Status: DC | PRN
  Filled 2015-09-10: qty 2

## 2015-09-10 MED ORDER — OXYTOCIN 40 UNITS IN LACTATED RINGERS INFUSION - SIMPLE MED
2.5000 [IU]/h | INTRAVENOUS | Status: DC
  Administered 2015-09-10: 2.5 [IU]/h via INTRAVENOUS
  Filled 2015-09-10: qty 1000

## 2015-09-10 MED ORDER — LACTATED RINGERS IV BOLUS (SEPSIS)
500.0000 mL | Freq: Once | INTRAVENOUS | Status: AC
  Administered 2015-09-10: 1000 mL via INTRAVENOUS

## 2015-09-10 MED ORDER — ACETAMINOPHEN 325 MG PO TABS: 650.0000 mg | ORAL_TABLET | ORAL | Status: DC | PRN

## 2015-09-10 MED ORDER — ONDANSETRON HCL 4 MG/2ML IJ SOLN: 4.0000 mg | Freq: Three times a day (TID) | INTRAMUSCULAR | Status: DC | PRN

## 2015-09-10 MED ORDER — LACTATED RINGERS IV SOLN
INTRAVENOUS | Status: DC
Start: 2015-09-10 — End: 2015-09-11
  Administered 2015-09-10 (×2): 1000 mL via INTRAVENOUS

## 2015-09-10 MED ORDER — PENICILLIN G POTASSIUM 5000000 UNITS IJ SOLR
2.5000 10*6.[IU] | INTRAVENOUS | Status: DC
  Administered 2015-09-10: 2.5 10*6.[IU] via INTRAVENOUS
  Filled 2015-09-10 (×7): qty 2.5

## 2015-09-10 NOTE — Anesthesia Preprocedure Evaluation (Signed)
Anesthesia Evaluation  Patient identified by MRN, date of birth, ID band Patient awake    Reviewed: Allergy & Precautions, H&P , NPO status , Patient's Chart, lab work & pertinent test results  History of Anesthesia Complications Negative for: history of anesthetic complications  Airway Mallampati: II  TM Distance: >3 FB Neck ROM: full    Dental no notable dental hx.    Pulmonary asthma , former smoker,    Pulmonary exam normal        Cardiovascular negative cardio ROS Normal cardiovascular exam     Neuro/Psych  Headaches, negative psych ROS   GI/Hepatic negative GI ROS, Neg liver ROS,   Endo/Other  negative endocrine ROS  Renal/GU negative Renal ROS  negative genitourinary   Musculoskeletal   Abdominal   Peds  Hematology negative hematology ROS (+) anemia ,   Anesthesia Other Findings   Reproductive/Obstetrics (+) Pregnancy                             Anesthesia Physical Anesthesia Plan  ASA: II  Anesthesia Plan: Epidural   Post-op Pain Management:    Induction:   Airway Management Planned:   Additional Equipment:   Intra-op Plan:   Post-operative Plan:   Informed Consent: I have reviewed the patients History and Physical, chart, labs and discussed the procedure including the risks, benefits and alternatives for the proposed anesthesia with the patient or authorized representative who has indicated his/her understanding and acceptance.     Plan Discussed with: Anesthesiologist  Anesthesia Plan Comments:         Anesthesia Quick Evaluation

## 2015-09-10 NOTE — Discharge Summary (Signed)
OB Discharge Summary  Patient Name: Melinda Barnett DOB: 05-25-1996 MRN: [id number]  Date of admission: 09/10/2015 Delivering MD: Thomasene Mohair, MD Date of Delivery: 09/10/2015  Date of discharge: 09/12/2015 Admitting diagnosis: 40 Weeks;Contractions Intrauterine pregnancy: [redacted]w[redacted]d     Secondary diagnosis: asthma, migraines     Discharge diagnosis: Term Pregnancy Delivered                                                                                                Post partum procedures:none  Augmentation: AROM  Complications: None  Hospital course:  Onset of Labor With Vaginal Delivery     20 y.o. yo G2P0010 at [redacted]w[redacted]d was admitted in Active Labor on 09/10/2015. Patient had an uncomplicated labor course as follows:  Membrane Rupture Time/Date: 10:48 AM ,09/10/2015   Intrapartum Procedures: Episiotomy: None                                        Lacerations:  None Patient had a delivery of a Viable infant. 09/10/2015  Information for the patient's newborn:  Melinda, Barnett [[id number]]  Delivery Method: Vaginal, Spontaneous Delivery (Filed from Delivery Summary)    Pateint had an uncomplicated postpartum course.  She is ambulating, tolerating a regular diet, passing flatus, and urinating well. Patient is discharged home in stable condition on 09/12/2015.    Physical exam  Filed Vitals:   09/10/15 1821 09/10/15 1930 09/10/15 1947 09/10/15 2001  BP: 117/64 112/75 108/73 122/89  Pulse: 72 156 92 74  Temp:      TempSrc:      Resp:      Height:      Weight:       General: alert, cooperative and no distress Lochia: appropriate Uterine Fundus: firm Incision: N/A DVT Evaluation: No evidence of DVT seen on physical exam.  Labs: Lab Results  Component Value Date   WBC 9.1 09/10/2015   HGB 11.2* 09/10/2015   HCT 34.0* 09/10/2015   MCV 82.8 09/10/2015   PLT 230 09/10/2015   CMP Latest Ref Rng 04/12/2015  Glucose 65 - 99 mg/dL 80  BUN 6 - 20 mg/dL 7  Creatinine 7.82 - 9.56 mg/dL  2.13(Y)  Sodium 865 - 145 mmol/L 135  Potassium 3.5 - 5.1 mmol/L 3.9  Chloride 101 - 111 mmol/L 107  CO2 22 - 32 mmol/L 21(L)  Calcium 8.9 - 10.3 mg/dL 9.6  Total Protein 6.5 - 8.1 g/dL 6.6  Total Bilirubin 0.3 - 1.2 mg/dL 0.3  Alkaline Phos 38 - 126 U/L 70  AST 15 - 41 U/L 16  ALT 14 - 54 U/L 16    Discharge instruction: per After Visit Summary.  Medications:    Medication List    ASK your doctor about these medications        albuterol 108 (90 Base) MCG/ACT inhaler  Commonly known as:  PROVENTIL HFA;VENTOLIN HFA  Inhale 2 puffs into the lungs every 6 (six) hours as needed for wheezing or shortness of breath.  montelukast 10 MG tablet  Commonly known as:  SINGULAIR  Take 10 mg by mouth at bedtime.     prenatal multivitamin Tabs tablet  Take 1 tablet by mouth daily at 12 noon.        Diet: routine diet  Activity: Advance as tolerated. Pelvic rest for 6 weeks.   Outpatient follow up: Postpartum Follow up in 6 weeks with Melinda Barnett, CNM   Postpartum contraception: Progesterone only pills Rhogam Given postpartum: no Rubella vaccine given postpartum: yes Varicella vaccine given postpartum: yes TDaP given antepartum or postpartum: TDaP given 02/09/15 Flu vaccine given antepartum or postpartum: no  Newborn Data: Live born female  Birth Weight: 6 lb 4 oz (2835 g) APGAR: 8, 9   Baby Feeding: Bottle and Breast  Disposition:home with mother  Melinda Barnett, CNM  This patient and plan were discussed with Dr Tiburcio PeaHarris 09/10/2015

## 2015-09-10 NOTE — Progress Notes (Signed)
  Labor Progress Note   20 y.o. G2P0010 @ 7345w3d , admitted for  Pregnancy, Labor Management.   Subjective:  Has just gotten epidural and already feeling more comfortable  Objective:  BP 118/78 mmHg  Pulse 63  Temp(Src) 98.2 F (36.8 C) (Oral)  Resp 16  Ht 5\' 1"  (1.549 m)  Wt 82.555 kg (182 lb)  BMI 34.41 kg/m2  LMP 12/01/2014 Abd: mild Extr: trace to 1+ bilateral pedal edema SVE: CERVIX: 9.5 cm dilated, 100% effaced, 0 station  EFM: FHR: 135 bpm, variability: moderate,  accelerations:  Present,  decelerations:  Absent Toco: Frequency: Every 2-3 minutes   Assessment & Plan:  G2P0010 @ 1145w3d, admitted for  Pregnancy and Labor/Delivery Management  1. Pain management: epidural. 2. FWB: FHT category I.  3. ID: GBS positive 4. Labor management: expect SVD All discussed with patient, see orders  Chalsey Leeth, CNM   This patient and plan were discussed with Dr Bonney AidStaebler 09/10/2015

## 2015-09-10 NOTE — OB Triage Note (Signed)
Pt. Reports ctx since 0300 this am. Ctx 5 minutes apart since 0800 this am. Denies any LOF. Elaina HoopsElks, Shemaiah Round S

## 2015-09-10 NOTE — Progress Notes (Signed)
Report received from [place].

## 2015-09-10 NOTE — Anesthesia Procedure Notes (Signed)
Epidural Patient location during procedure: OB Start time: 09/10/2015 1:29 PM End time: 09/10/2015 1:30 PM  Staffing Resident/CRNA: Stormy FabianURTIS, Damian Hofstra Performed by: resident/CRNA   Preanesthetic Checklist Completed: patient identified, site marked, surgical consent, pre-op evaluation, timeout performed, IV checked, risks and benefits discussed and monitors and equipment checked  Epidural Patient position: sitting Prep: Betadine Patient monitoring: heart rate, continuous pulse ox and blood pressure Approach: midline Location: L4-L5 Injection technique: LOR saline  Needle:  Needle type: Tuohy  Needle gauge: 17 G Needle length: 9 cm and 9 Needle insertion depth: 6 cm Catheter type: closed end flexible Catheter size: 19 Gauge Catheter at skin depth: 10 cm Test dose: negative and 1.5% lidocaine with Epi 1:200 K  Assessment Sensory level: T8 Events: blood not aspirated, injection not painful, no injection resistance, negative IV test and no paresthesia  Additional Notes   Patient tolerated the insertion well without immediate complications.Reason for block:procedure for pain

## 2015-09-10 NOTE — H&P (Signed)
OB History & Physical   History of Present Illness:  Chief Complaint: IUP at 40 3/7 with Contractions  HPI:  Melinda Barnett is a 20 y.o. G2P0010 female at [redacted]w[redacted]d dated by LMP.  Her pregnancy has been complicated by asthma, migraine headaches, trichomoniasis, preterm labor, gbs.    She reports contractions.   She denies leakage of fluid.   She denies vaginal bleeding.   She reports fetal movement.    Maternal Medical History:   Past Medical History  Diagnosis Date  . Asthma   . Headache 20 years old  . Anemia affecting pregnancy in second trimester, antepartum     Past Surgical History  Procedure Laterality Date  . No past surgeries      No Known Allergies  Prior to Admission medications   Medication Sig Start Date End Date Taking? Authorizing Provider  albuterol (PROVENTIL HFA;VENTOLIN HFA) 108 (90 BASE) MCG/ACT inhaler Inhale 2 puffs into the lungs every 6 (six) hours as needed for wheezing or shortness of breath.   Yes Historical Provider, MD  montelukast (SINGULAIR) 10 MG tablet Take 10 mg by mouth at bedtime.   Yes Historical Provider, MD  Prenatal Vit-Fe Fumarate-FA (PRENATAL MULTIVITAMIN) TABS tablet Take 1 tablet by mouth daily at 12 noon.   Yes Historical Provider, MD    OB History  Gravida Para Term Preterm AB SAB TAB Ectopic Multiple Living     # Outcome Date GA Lbr Len/2nd Weight Sex Delivery Anes PTL Lv  2 Current           1 SAB 07/08/11              Prenatal care site: [facility]  Social History: She  reports that she has quit smoking. She has never used smokeless tobacco. She reports that she does not drink alcohol or use illicit drugs.  Family History: family history includes Heart disease in her father; Stroke in her father.   Review of Systems: Negative x 10 systems reviewed except as noted in the HPI.    Physical Exam:  Vital Signs: BP 118/78 mmHg  Pulse 63  Temp(Src) 98.2 F (36.8 C) (Oral)  Resp 16  Ht  (1.549  m)  Wt 82.555 kg (182 lb)  BMI 34.41 kg/m2  LMP 12/01/2014 General: no acute distress.  HEENT: normocephalic, atraumatic Heart: regular rate & rhythm.  No murmurs/rubs/gallops Lungs: clear to auscultation bilaterally Abdomen: soft, gravid, non-tender;  EFW: 7.75 pounds Pelvic:   External: Normal external female genitalia  Cervix: Dilation: Lip/rim / Effacement (%): 100 / Station: 0  Extremities: non-tender, symmetric, No edema bilaterally.  DTRs: 2+ bilaterally  Neurologic: Alert & oriented x 3.    Pertinent Results:  Prenatal Labs: Blood type/Rh B positive  Antibody screen negative  Rubella Not immune  Varicella Not immune    RPR NR  HBsAg negative  HIV negative  GC negative  Chlamydia negative  Genetic screening NA  1 hour GTT 96 on 12/14  3 hour GTT NA  GBS positive on 2/1   Baseline FHR: 135 beats/min   Variability: moderate   Accelerations: present   Decelerations: absent Contractions: present frequency: 2-3 minutes Overall assessment: Category I   Assessment:  Melinda Barnett is a 20 y.o. G58P0010 female at [redacted]w[redacted]d with regular uterine contractions   Plan:  1. Admit to Labor & Delivery  2. CBC, T&S, Clrs, IVF 3. GBS positive. Ppx Penicillin  4. Fetal well-being: Category I 5. Epidural as desired  Joshua Soulier, CNM  This patient and plan were discussed with Dr Bonney AidStaebler 09/10/2015

## 2015-09-11 LAB — CBC
HEMATOCRIT: 27.4 % — AB (ref 35.0–47.0)
HEMOGLOBIN: 9.1 g/dL — AB (ref 12.0–16.0)
MCH: 26.9 pg (ref 26.0–34.0)
MCHC: 33.1 g/dL (ref 32.0–36.0)
MCV: 81.4 fL (ref 80.0–100.0)
Platelets: 199 10*3/uL (ref 150–440)
RBC: 3.37 MIL/uL — AB (ref 3.80–5.20)
RDW: 15.1 % — ABNORMAL HIGH (ref 11.5–14.5)
WBC: 13.7 10*3/uL — AB (ref 3.6–11.0)

## 2015-09-11 LAB — CHLAMYDIA/NGC RT PCR (ARMC ONLY)
Chlamydia Tr: NOT DETECTED
N GONORRHOEAE: NOT DETECTED

## 2015-09-11 LAB — RPR: RPR: NONREACTIVE

## 2015-09-11 MED ORDER — WITCH HAZEL-GLYCERIN EX PADS: 1.0000 "application " | MEDICATED_PAD | CUTANEOUS | Status: DC | PRN

## 2015-09-11 MED ORDER — PRENATAL MULTIVITAMIN CH
1.0000 | ORAL_TABLET | Freq: Every day | ORAL | Status: DC
  Administered 2015-09-11 – 2015-09-12 (×2): 1 via ORAL
  Filled 2015-09-11 (×2): qty 1

## 2015-09-11 MED ORDER — SIMETHICONE 80 MG PO CHEW: 80.0000 mg | CHEWABLE_TABLET | ORAL | Status: DC | PRN

## 2015-09-11 MED ORDER — IBUPROFEN 600 MG PO TABS
600.0000 mg | ORAL_TABLET | Freq: Four times a day (QID) | ORAL | Status: DC
  Administered 2015-09-11 – 2015-09-12 (×4): 600 mg via ORAL
  Filled 2015-09-11 (×5): qty 1

## 2015-09-11 MED ORDER — ONDANSETRON HCL 4 MG PO TABS: 4.0000 mg | ORAL_TABLET | ORAL | Status: DC | PRN

## 2015-09-11 MED ORDER — BENZOCAINE-MENTHOL 20-0.5 % EX AERO: 1.0000 "application " | INHALATION_SPRAY | CUTANEOUS | Status: DC | PRN

## 2015-09-11 MED ORDER — ACETAMINOPHEN 325 MG PO TABS: 650.0000 mg | ORAL_TABLET | ORAL | Status: DC | PRN

## 2015-09-11 MED ORDER — ONDANSETRON HCL 4 MG/2ML IJ SOLN
4.0000 mg | INTRAMUSCULAR | Status: DC | PRN
Start: 2015-09-11 — End: 2015-09-12

## 2015-09-11 MED ORDER — SENNOSIDES-DOCUSATE SODIUM 8.6-50 MG PO TABS: 2.0000 | ORAL_TABLET | ORAL | Status: DC

## 2015-09-11 MED ORDER — DIPHENHYDRAMINE HCL 25 MG PO CAPS: 25.0000 mg | ORAL_CAPSULE | Freq: Four times a day (QID) | ORAL | Status: DC | PRN

## 2015-09-11 MED ORDER — DIBUCAINE 1 % RE OINT: 1.0000 "application " | TOPICAL_OINTMENT | RECTAL | Status: DC | PRN

## 2015-09-11 MED ORDER — LANOLIN HYDROUS EX OINT: TOPICAL_OINTMENT | CUTANEOUS | Status: DC | PRN

## 2015-09-11 MED ORDER — MEASLES, MUMPS & RUBELLA VAC ~~LOC~~ INJ
0.5000 mL | INJECTION | Freq: Once | SUBCUTANEOUS | Status: AC
  Administered 2015-09-12: 0.5 mL via SUBCUTANEOUS
  Filled 2015-09-11: qty 0.5

## 2015-09-11 MED ORDER — VARICELLA VIRUS VACCINE LIVE 1350 PFU/0.5ML IJ SUSR
0.5000 mL | Freq: Once | INTRAMUSCULAR | Status: AC
  Administered 2015-09-12: 0.5 mL via SUBCUTANEOUS
  Filled 2015-09-11: qty 0.5

## 2015-09-11 NOTE — Care Management (Signed)
Received referral for wanting more information about medicaid. Discussed that Ms. Henreitta LeberBridges would need to call the Department of Social Services and arrange an appointment to begin the application process for Medicaid for the new baby girl. Gwenette GreetBrenda S Kamoni Gentles RN MSN CCM Care Management [phone]

## 2015-09-11 NOTE — Progress Notes (Signed)
°   09/11/15 1000  Clinical Encounter Type  Visited With Patient and family together  Visit Type Initial  Referral From Nurse  Consult/Referral To Chaplain  Pastoral care visit. Chaplain Larose Kellsvelyn Crews Ext [phone]

## 2015-09-11 NOTE — Progress Notes (Signed)
Post Partum Day 1 Subjective: Doing well, no complaints.  Tolerating regular diet, pain with PO meds, voiding and ambulating without difficulty.  No CP SOB F/C N/V or leg pain No HA, change of vision, RUQ/epigastric pain  Objective: BP 125/80 mmHg  Pulse 59  Temp(Src) 98 F (36.7 C) (Oral)  Resp 17  Ht 5' 1"  (1.549 m)  Wt 82.555 kg (182 lb)  BMI 34.41 kg/m2  SpO2 100%  LMP 12/01/2014  Physical Exam:  General: NAD CV: RRR Pulm: nl effort, CTABL Lochia: moderate Uterine Fundus: fundus firm and below umbilicus DVT Evaluation: no cords, ttp LEs    Recent Labs  09/10/15 1112 09/11/15 0649  HGB 11.2* 9.1*  HCT 34.0* 27.4*  WBC 9.1 13.7*  PLT 230 199    Assessment/Plan: 20 y.o. G2P0010 postpartum day # 1  1. Continue routine postpartum care  2. Breastfeeding  3. Contraception: plans mini pill  4. Will need MMR and Varivax before DC  5. TDAP UTD  6. Disposition: DC to home tomorrow   Rod Can, CNM   This patient and plan were discussed with Dr Ilda Basset 09/11/2015

## 2015-09-11 NOTE — Anesthesia Postprocedure Evaluation (Signed)
Anesthesia Post Note  Patient: Melinda Barnett  Procedure(s) Performed: * No procedures listed *  Patient location during evaluation: Mother Baby Anesthesia Type: Epidural Level of consciousness: awake, awake and alert and oriented Pain management: pain level controlled Vital Signs Assessment: vitals unstable Respiratory status: spontaneous breathing, nonlabored ventilation and respiratory function stable Cardiovascular status: stable Postop Assessment: no headache and no backache Anesthetic complications: no    Last Vitals:  Filed Vitals:   09/11/15 0018 09/11/15 0421  BP: 118/64 102/68  Pulse: 84 73  Temp: 36.9 C 36.7 C  Resp: 18 18    Last Pain:  Filed Vitals:   09/11/15 0428  PainSc: 0-No pain                 Tahirah Sara,  Sheran FavaMark R

## 2015-09-12 ENCOUNTER — Encounter: Payer: Self-pay | Admitting: *Deleted

## 2015-09-12 MED ORDER — NORETHINDRONE ACETATE 5 MG PO TABS: 5.0000 mg | ORAL_TABLET | Freq: Every day | ORAL | Status: DC

## 2015-09-12 MED ORDER — VARICELLA VIRUS VACCINE LIVE 1350 PFU/0.5ML IJ SUSR: 0.5000 mL | Freq: Once | INTRAMUSCULAR | Status: DC

## 2015-09-12 MED ORDER — MEASLES, MUMPS & RUBELLA VAC ~~LOC~~ INJ: 0.5000 mL | INJECTION | Freq: Once | SUBCUTANEOUS | Status: DC

## 2015-09-12 NOTE — Progress Notes (Signed)
Patient was stated "my nipples are very sore and hurt too bad to breastfeed". I gave the patient cool gels and lanolin oil to help soothe her nipples and tried to reassure her breastfeeding would get better. She stated she would try to breastfeed again later, but wanted to bottlefeed her baby this feeding. Patient was educated & signed formula consent.

## 2015-10-09 IMAGING — CR DG CHEST 2V
1 series · 2 of 2 positions shown · non-contrast
Comparison: 01/23/2014

CLINICAL DATA: Three day history of vomiting.

EXAM:
CHEST  2 VIEW

[Series 1: w chest pa · 0.14mm/px · 2 of 2 slices shown]
[im 1/2]
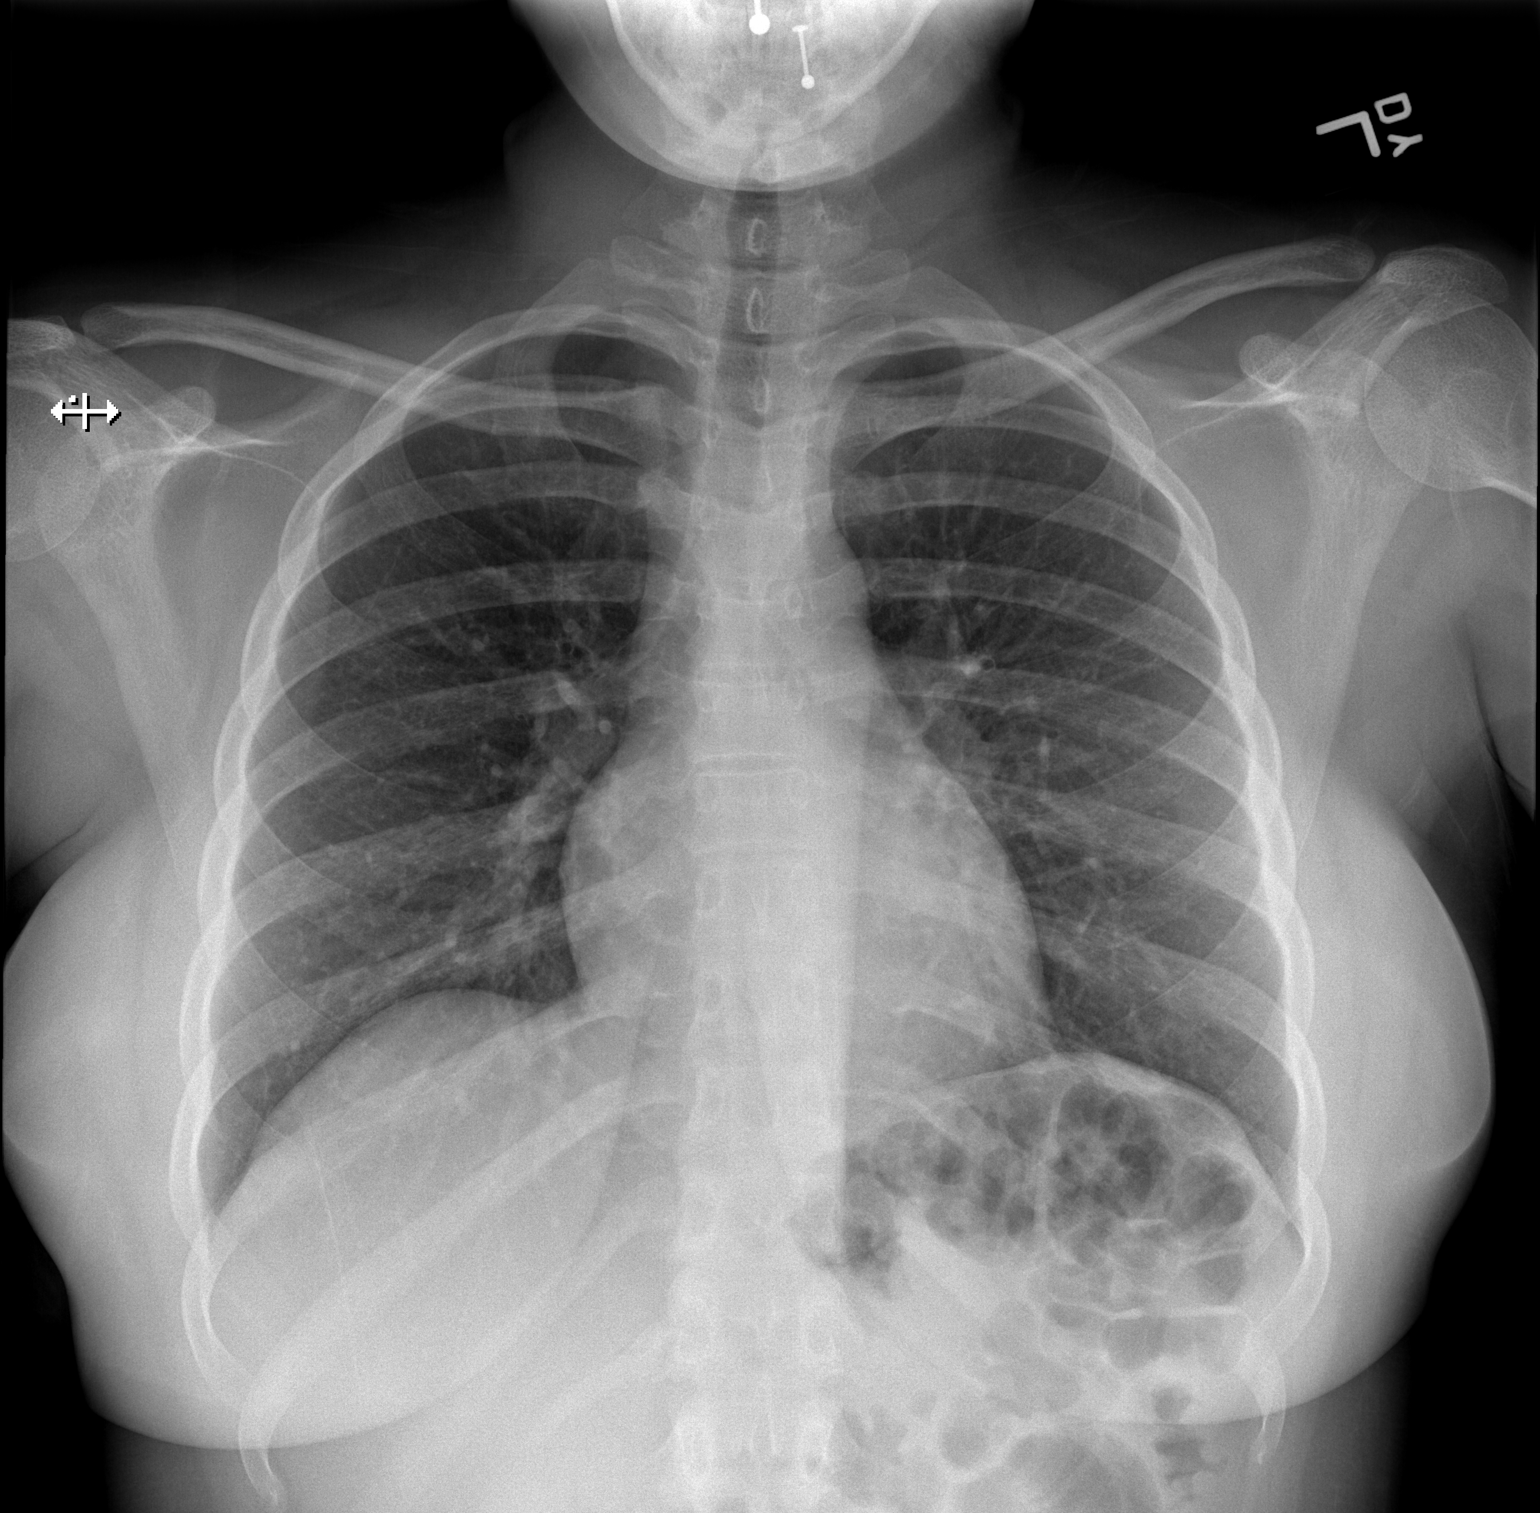
[im 2/2]
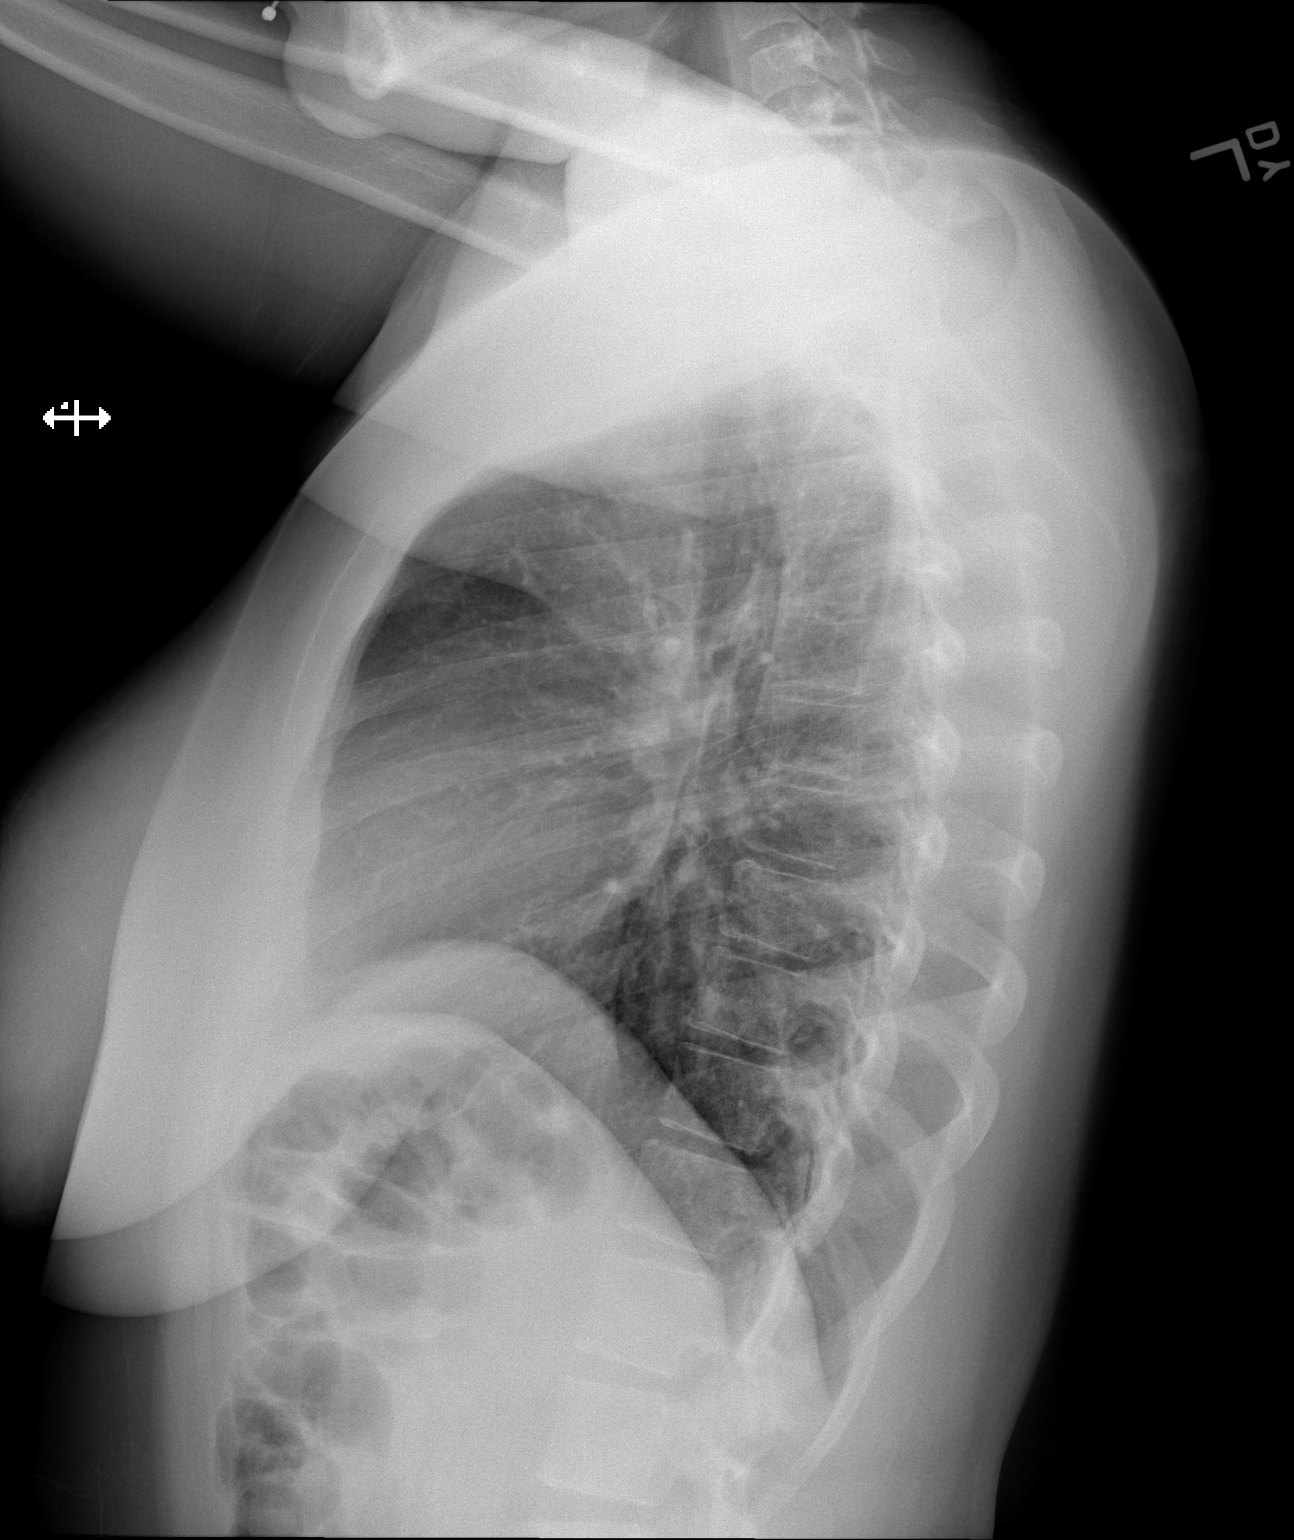

[2 of 2 positions shown; findings below may reference images not displayed]

FINDINGS: The heart size and mediastinal contours are within normal limits.
Both lungs are clear. The visualized skeletal structures are
unremarkable.
IMPRESSION: Normal chest x-ray.

## 2015-12-27 ENCOUNTER — Emergency Department: Payer: Medicaid Other

## 2015-12-27 ENCOUNTER — Emergency Department
Admission: EM | Admit: 2015-12-27 | Discharge: 2015-12-27 | Disposition: A | Discharge status: Home / Self Care | Payer: Medicaid Other | Attending: Emergency Medicine | Admitting: Emergency Medicine

## 2015-12-27 ENCOUNTER — Encounter: Payer: Self-pay | Admitting: Emergency Medicine

## 2015-12-27 DIAGNOSIS — R1011 Right upper quadrant pain: Secondary | ICD-10-CM | POA: Insufficient documentation

## 2015-12-27 DIAGNOSIS — Z87891 Personal history of nicotine dependence: Secondary | ICD-10-CM | POA: Insufficient documentation

## 2015-12-27 DIAGNOSIS — Z79899 Other long term (current) drug therapy: Secondary | ICD-10-CM | POA: Diagnosis not present

## 2015-12-27 DIAGNOSIS — J45909 Unspecified asthma, uncomplicated: Secondary | ICD-10-CM | POA: Diagnosis not present

## 2015-12-27 LAB — CBC
HEMATOCRIT: 37.3 % (ref 35.0–47.0)
HEMOGLOBIN: 12.2 g/dL (ref 12.0–16.0)
MCH: 27.3 pg (ref 26.0–34.0)
MCHC: 32.8 g/dL (ref 32.0–36.0)
MCV: 83.4 fL (ref 80.0–100.0)
Platelets: 232 10*3/uL (ref 150–440)
RBC: 4.48 MIL/uL (ref 3.80–5.20)
RDW: 18.1 % — ABNORMAL HIGH (ref 11.5–14.5)
WBC: 4.9 10*3/uL (ref 3.6–11.0)

## 2015-12-27 LAB — COMPREHENSIVE METABOLIC PANEL
ALBUMIN: 4.1 g/dL (ref 3.5–5.0)
ALT: 15 U/L (ref 14–54)
ANION GAP: 6 (ref 5–15)
AST: 18 U/L (ref 15–41)
Alkaline Phosphatase: 65 U/L (ref 38–126)
BUN: 9 mg/dL (ref 6–20)
CO2: 23 mmol/L (ref 22–32)
Calcium: 9.1 mg/dL (ref 8.9–10.3)
Chloride: 107 mmol/L (ref 101–111)
Creatinine, Ser: 0.71 mg/dL (ref 0.44–1.00)
GFR calc Af Amer: >60 mL/min (ref >60)
GFR calc non Af Amer: >60 mL/min (ref >60)
GLUCOSE: 90 mg/dL (ref 65–99)
POTASSIUM: 3.8 mmol/L (ref 3.5–5.1)
SODIUM: 136 mmol/L (ref 135–145)
Total Bilirubin: 0.3 mg/dL (ref 0.3–1.2)
Total Protein: 7.2 g/dL (ref 6.5–8.1)

## 2015-12-27 LAB — URINALYSIS COMPLETE WITH MICROSCOPIC (ARMC ONLY)
Bacteria, UA: NONE SEEN
Bilirubin Urine: NEGATIVE
Glucose, UA: NEGATIVE mg/dL
Hgb urine dipstick: NEGATIVE
Ketones, ur: NEGATIVE mg/dL
Nitrite: NEGATIVE
Protein, ur: NEGATIVE mg/dL
SPECIFIC GRAVITY, URINE: 1.027 (ref 1.005–1.030)
pH: 5 (ref 5.0–8.0)

## 2015-12-27 LAB — LIPASE, BLOOD: Lipase: 30 U/L (ref 11–51)

## 2015-12-27 LAB — PREGNANCY, URINE: PREG TEST UR: NEGATIVE

## 2015-12-27 MED ORDER — GI COCKTAIL ~~LOC~~
30.0000 mL | Freq: Once | ORAL | Status: AC
  Administered 2015-12-27: 30 mL via ORAL

## 2015-12-27 MED ORDER — GI COCKTAIL ~~LOC~~
ORAL | Status: AC
  Filled 2015-12-27: qty 30

## 2015-12-27 NOTE — Discharge Instructions (Signed)

## 2015-12-27 NOTE — ED Notes (Signed)
Pt to ed with c/o abd pain x 4 weeks.  Denies n/v/d.

## 2015-12-27 NOTE — ED Provider Notes (Signed)
[facility] Emergency Department Provider Note   ____________________________________________  Time seen: Approximately 330 PM  I have reviewed the triage vital signs and the nursing notes.   HISTORY  Chief Complaint Abdominal Pain   HPI Melinda Barnett is a 20 y.o. female with a history of asthma and anemia who is presenting to the emergency department today with right upper quadrant abdominal pain over the past 4 weeks. She says that the pain was previously intermittent and worse with movement but she says it is now constant.  Denies any nausea vomiting or diarrhea. Denies any dysuria. Denies any vaginal bleeding or discharge. Says the pain is an 8 out of 10 and sharp. Denies any radiation to the back. Says that it does not worsen with eating. Denies any chest pain or shortness of breath. Denies any heavy lifting. Denies any known injury.Has not tried any medications for relief such as acid reducers.   Past Medical History  Diagnosis Date  . Asthma   . Headache 20 years old  . Anemia affecting pregnancy in second trimester, antepartum     Patient Active Problem List   Diagnosis Date Noted  . Postpartum care following vaginal delivery 09/11/2015  . Labor and delivery indication for care or intervention 09/10/2015  . Decreased fetal movement 09/07/2015  . Irregular uterine contractions 09/01/2015  . Irregular contractions 09/01/2015  . Abdominal pain affecting pregnancy 08/25/2015  . Indication for care in labor and delivery, antepartum 08/08/2015  . Labor and delivery, indication for care 07/10/2015  . Preterm labor 06/12/2015  . Pelvic pressure in pregnancy, antepartum 06/11/2015    Past Surgical History  Procedure Laterality Date  . No past surgeries      Current Outpatient Rx  Name  Route  Sig  Dispense  Refill  . albuterol (PROVENTIL HFA;VENTOLIN HFA) 108 (90 BASE) MCG/ACT inhaler   Inhalation   Inhale 2 puffs into the lungs every 6  (six) hours as needed for wheezing or shortness of breath.         . measles, mumps and rubella vaccine (MMR) injection   Subcutaneous   Inject 0.5 mLs into the skin once.   1 each   0   . montelukast (SINGULAIR) 10 MG tablet   Oral   Take 10 mg by mouth at bedtime.         . norethindrone (AYGESTIN) 5 MG tablet   Oral   Take 1 tablet (5 mg total) by mouth daily. At the same time every day   28 tablet   2   . Prenatal Vit-Fe Fumarate-FA (PRENATAL MULTIVITAMIN) TABS tablet   Oral   Take 1 tablet by mouth daily at 12 noon.         . varicella virus vaccine live (VARIVAX) 1350 PFU/0.5ML INJ injection   Subcutaneous   Inject 0.5 mLs into the skin once.   1 each   0     Allergies Review of patient's allergies indicates no known allergies.  Family History  Problem Relation Age of Onset  . Heart disease Father   . Stroke Father     Social History Social History  Substance Use Topics  . Smoking status: Former Research scientist (life sciences)  . Smokeless tobacco: Never Used  . Alcohol Use: No    Review of Systems Constitutional: No fever/chills Eyes: No visual changes. ENT: No sore throat. Cardiovascular: Denies chest pain. Respiratory: Denies shortness of breath. Gastrointestinal:   No nausea, no vomiting.  No diarrhea.  No constipation. Genitourinary: Negative for dysuria. Musculoskeletal: Negative for back pain. Skin: Negative for rash. Neurological: Negative for headaches, focal weakness or numbness.  10-point ROS otherwise negative.  ____________________________________________   PHYSICAL EXAM:  VITAL SIGNS: ED Triage Vitals  Enc Vitals Group     BP 12/27/15 1410 115/71 mmHg     Pulse Rate 12/27/15 1410 60     Resp 12/27/15 1410 20     Temp 12/27/15 1410 98.2 F (36.8 C)     Temp Source 12/27/15 1410 Oral     SpO2 12/27/15 1410 100 %     Weight 12/27/15 1410 185 lb (83.915 kg)     Height 12/27/15 1410 5' 1"  (1.549 m)     Head Cir --      Peak Flow --      Pain  Score 12/27/15 1411 8     Pain Loc --      Pain Edu? --      Excl. in [place]? --     Constitutional: Alert and oriented. Well appearing and in no acute distress. Eyes: Conjunctivae are normal. PERRL. EOMI. Head: Atraumatic. Nose: No congestion/rhinnorhea. Mouth/Throat: Mucous membranes are moist.   Neck: No stridor.   Cardiovascular: Normal rate, regular rhythm. Grossly normal heart sounds.   Respiratory: Normal respiratory effort.  No retractions. Lungs CTAB. Gastrointestinal: Soft with tenderness across the upper abdomen. There is no rebound or guarding. There is a negative Murphy sign no lower abdominal tenderness. No distention. No CVA tenderness. Musculoskeletal: No lower extremity tenderness nor edema.  No joint effusions. Neurologic:  Normal speech and language. No gross focal neurologic deficits are appreciated. No gait instability. Skin:  Skin is warm, dry and intact. No rash noted. Psychiatric: Mood and affect are normal. Speech and behavior are normal.  ____________________________________________   LABS (all labs ordered are listed, but only abnormal results are displayed)  Labs Reviewed  CBC - Abnormal; Notable for the following:    RDW 18.1 (*)    All other components within normal limits  URINALYSIS COMPLETEWITH MICROSCOPIC ([facility] ONLY) - Abnormal; Notable for the following:    Color, Urine YELLOW (*)    APPearance CLOUDY (*)    Leukocytes, UA 2+ (*)    Squamous Epithelial / LPF 6-30 (*)    All other components within normal limits  URINE CULTURE  LIPASE, BLOOD  COMPREHENSIVE METABOLIC PANEL  PREGNANCY, URINE   ____________________________________________  EKG   ____________________________________________  RADIOLOGY   US Abdomen Limited RUQ (Final result) Result time: 12/27/15 16:23:38   Final result by Rad Results In Interface (12/27/15 16:23:38)   Narrative:   CLINICAL DATA: Right upper quadrant pain  EXAM: US ABDOMEN LIMITED - RIGHT UPPER  QUADRANT  COMPARISON: Ultrasound abdomen 06/08/2011  FINDINGS: Gallbladder:  No gallstones or wall thickening visualized. No sonographic Murphy sign noted by sonographer.  Common bile duct:  Diameter: 3 mm  Liver:  No focal lesion identified. Within normal limits in parenchymal echogenicity.  IMPRESSION: Negative right upper quadrant ultrasound   Electronically Signed By: Franchot Gallo M.D. On: 12/27/2015 16:23       ____________________________________________   PROCEDURES  ____________________________________________   INITIAL IMPRESSION / ASSESSMENT AND PLAN / ED COURSE  Pertinent labs & imaging results that were available during my care of the patient were reviewed by me and considered in my medical decision making (see chart for details).  ----------------------------------------- 4:51 PM on 12/27/2015 -----------------------------------------  Patient with very reassuring workup. Urine with some signs of UTI but  also with squamous epithelial cells. Patient denies any dysuria. Unclear cause of the patient's abdominal pain. However, after 4 weeks if this was an emergent cause unexpected seen abnormality in her vital signs are on her lab work. Upon reevaluation she is lying on the gurney, watching television and with no objective outward signs of pain. She said that the GI cocktail did not help with her pain. She says that at home she has tried ibuprofen without relief. Unclear cause of the pain. However, worsening with movement suggests that this could be an abdominal wall pain. I recommended Aspercreme or icy hot for further relief. I will also be giving her follow up information for the [facility]. She is understanding the plan for follow-up and willing to comply. ____________________________________________   FINAL CLINICAL IMPRESSION(S) / ED DIAGNOSES  Final diagnoses:  RUQ abdominal pain      NEW MEDICATIONS STARTED DURING THIS  VISIT:  New Prescriptions   No medications on file     Note:  This document was prepared using Dragon voice recognition software and may include unintentional dictation errors.    Orbie Pyo, MD 12/27/15 [phone]

## 2015-12-28 LAB — URINE CULTURE

## 2016-09-20 ENCOUNTER — Encounter: Payer: Self-pay | Admitting: Emergency Medicine

## 2016-09-20 ENCOUNTER — Emergency Department: Payer: Medicaid Other

## 2016-09-20 ENCOUNTER — Emergency Department
Admission: EM | Admit: 2016-09-20 | Discharge: 2016-09-20 | Disposition: A | Discharge status: Home / Self Care | Payer: Medicaid Other | Attending: Emergency Medicine | Admitting: Emergency Medicine

## 2016-09-20 DIAGNOSIS — Y999 Unspecified external cause status: Secondary | ICD-10-CM | POA: Insufficient documentation

## 2016-09-20 DIAGNOSIS — J45909 Unspecified asthma, uncomplicated: Secondary | ICD-10-CM | POA: Insufficient documentation

## 2016-09-20 DIAGNOSIS — Y939 Activity, unspecified: Secondary | ICD-10-CM | POA: Diagnosis not present

## 2016-09-20 DIAGNOSIS — Z87891 Personal history of nicotine dependence: Secondary | ICD-10-CM | POA: Insufficient documentation

## 2016-09-20 DIAGNOSIS — X58XXXA Exposure to other specified factors, initial encounter: Secondary | ICD-10-CM | POA: Diagnosis not present

## 2016-09-20 DIAGNOSIS — Z79899 Other long term (current) drug therapy: Secondary | ICD-10-CM | POA: Insufficient documentation

## 2016-09-20 DIAGNOSIS — S46912A Strain of unspecified muscle, fascia and tendon at shoulder and upper arm level, left arm, initial encounter: Secondary | ICD-10-CM | POA: Insufficient documentation

## 2016-09-20 DIAGNOSIS — Y929 Unspecified place or not applicable: Secondary | ICD-10-CM | POA: Insufficient documentation

## 2016-09-20 DIAGNOSIS — S4992XA Unspecified injury of left shoulder and upper arm, initial encounter: Secondary | ICD-10-CM | POA: Diagnosis present

## 2016-09-20 MED ORDER — TRAMADOL HCL 50 MG PO TABS: 50.0000 mg | ORAL_TABLET | Freq: Four times a day (QID) | ORAL | 0 refills | Status: DC | PRN

## 2016-09-20 MED ORDER — CYCLOBENZAPRINE HCL 10 MG PO TABS: 10.0000 mg | ORAL_TABLET | Freq: Three times a day (TID) | ORAL | 0 refills | Status: DC | PRN

## 2016-09-20 NOTE — ED Triage Notes (Signed)
L collarbone pain began yesterday, denies injury, states today L shoulder pain.

## 2016-09-20 NOTE — Discharge Instructions (Signed)
Wear arm sling for 2 days.

## 2016-09-20 NOTE — ED Provider Notes (Signed)
[facility] Emergency Department Provider Note   ____________________________________________   None    (approximate)  I have reviewed the triage vital signs and the nursing notes.   HISTORY  Chief Complaint Shoulder Pain    HPI Melinda Barnett is a 21 y.o. female patient complaining of left clavicle and shoulder pain without injury for 2 days. Patient stated pain increases with movement of the shoulder. Patient rated the pain as a 9/10. No palliative measures for this complaint. Patient is right-hand dominant.   Past Medical History:  Diagnosis Date  . Anemia affecting pregnancy in second trimester, antepartum   . Asthma   . Headache 21 years old    Patient Active Problem List   Diagnosis Date Noted  . Postpartum care following vaginal delivery 09/11/2015  . Labor and delivery indication for care or intervention 09/10/2015  . Decreased fetal movement 09/07/2015  . Irregular uterine contractions 09/01/2015  . Irregular contractions 09/01/2015  . Abdominal pain affecting pregnancy 08/25/2015  . Indication for care in labor and delivery, antepartum 08/08/2015  . Labor and delivery, indication for care 07/10/2015  . Preterm labor 06/12/2015  . Pelvic pressure in pregnancy, antepartum 06/11/2015    Past Surgical History:  Procedure Laterality Date  . NO PAST SURGERIES      Prior to Admission medications   Medication Sig Start Date End Date Taking? Authorizing Provider  albuterol (PROVENTIL HFA;VENTOLIN HFA) 108 (90 BASE) MCG/ACT inhaler Inhale 2 puffs into the lungs every 6 (six) hours as needed for wheezing or shortness of breath.    Historical Provider, MD  cyclobenzaprine (FLEXERIL) 10 MG tablet Take 1 tablet (10 mg total) by mouth 3 (three) times daily as needed. 09/20/16   Sable Feil, PA-C  measles, mumps and rubella vaccine (MMR) injection Inject 0.5 mLs into the skin once. 09/12/15   Rod Can, CNM  montelukast (SINGULAIR) 10 MG  tablet Take 10 mg by mouth at bedtime.    Historical Provider, MD  norethindrone (AYGESTIN) 5 MG tablet Take 1 tablet (5 mg total) by mouth daily. At the same time every day 09/23/15   Rod Can, CNM  Prenatal Vit-Fe Fumarate-FA (PRENATAL MULTIVITAMIN) TABS tablet Take 1 tablet by mouth daily at 12 noon.    Historical Provider, MD  traMADol (ULTRAM) 50 MG tablet Take 1 tablet (50 mg total) by mouth every 6 (six) hours as needed for moderate pain. 09/20/16   Sable Feil, PA-C  varicella virus vaccine live (VARIVAX) 1350 PFU/0.5ML INJ injection Inject 0.5 mLs into the skin once. 09/12/15   Rod Can, CNM    Allergies Patient has no known allergies.  Family History  Problem Relation Age of Onset  . Heart disease Father   . Stroke Father     Social History Social History  Substance Use Topics  . Smoking status: Former Research scientist (life sciences)  . Smokeless tobacco: Never Used  . Alcohol use No    Review of Systems Constitutional: No fever/chills Eyes: No visual changes. ENT: No sore throat. Cardiovascular: Denies chest pain. Respiratory: Denies shortness of breath. Gastrointestinal: No abdominal pain.  No nausea, no vomiting.  No diarrhea.  No constipation. Genitourinary: Negative for dysuria. Musculoskeletal:Left shoulder pain Skin: Negative for rash. Neurological: Negative for headaches, focal weakness or numbness.    ____________________________________________   PHYSICAL EXAM:  VITAL SIGNS: ED Triage Vitals  Enc Vitals Group     BP 09/20/16 1347 (!) 157/69     Pulse Rate 09/20/16 1347 95  Resp 09/20/16 1347 20     Temp 09/20/16 1347 97.9 F (36.6 C)     Temp Source 09/20/16 1347 Oral     SpO2 09/20/16 1347 100 %     Weight 09/20/16 1348 175 lb (79.4 kg)     Height 09/20/16 1348 _0  (1.549 m)     Head Circumference --      Peak Flow --      Pain Score 09/20/16 1348 9     Pain Loc --      Pain Edu? --      Excl. in [place]? --     Constitutional: Alert and oriented.  Well appearing and in no acute distress. Eyes: Conjunctivae are normal. PERRL. EOMI. Head: Atraumatic. Nose: No congestion/rhinnorhea. Mouth/Throat: Mucous membranes are moist.  Oropharynx non-erythematous. Neck: No stridor.  No cervical spine tenderness to palpation. Hematological/Lymphatic/Immunilogical: No cervical lymphadenopathy. Cardiovascular: Normal rate, regular rhythm. Grossly normal heart sounds.  Good peripheral circulation. Respiratory: Normal respiratory effort.  No retractions. Lungs CTAB. Gastrointestinal: Soft and nontender. No distention. No abdominal bruits. No CVA tenderness. Musculoskeletal:No obvious deformity, abrasion, edema or erythema. Patient has decreased range of motion of overhead reaching the back complaining of pain.  Neurologic:  Normal speech and language. No gross focal neurologic deficits are appreciated. No gait instability. Skin:  Skin is warm, dry and intact. No rash noted. Psychiatric: Mood and affect are normal. Speech and behavior are normal.  ____________________________________________   LABS (all labs ordered are listed, but only abnormal results are displayed)  Labs Reviewed - No data to display ____________________________________________  EKG   ____________________________________________  RADIOLOGY  No acute findings x-ray of the left shoulder. ____________________________________________   PROCEDURES  Procedure(s) performed: None  Procedures  Critical Care performed: No  ____________________________________________   INITIAL IMPRESSION / ASSESSMENT AND PLAN / ED COURSE  Pertinent labs & imaging results that were available during my care of the patient were reviewed by me and considered in my medical decision making (see chart for details).  Left shoulder pain. Patient given discharge care instructions. Discussed x-ray finding with patient. Patient given sling to wear as needed for 2-3 days. Patient given a  prescription for Flexeril and 3 days of tramadol. Patient [facility].      ____________________________________________   FINAL CLINICAL IMPRESSION(S) / ED DIAGNOSES  Final diagnoses:  Strain of left shoulder, initial encounter      NEW MEDICATIONS STARTED DURING THIS VISIT:  New Prescriptions   CYCLOBENZAPRINE (FLEXERIL) 10 MG TABLET    Take 1 tablet (10 mg total) by mouth 3 (three) times daily as needed.   TRAMADOL (ULTRAM) 50 MG TABLET    Take 1 tablet (50 mg total) by mouth every 6 (six) hours as needed for moderate pain.     Note:  This document was prepared using Dragon voice recognition software and may include unintentional dictation errors.    Sable Feil, PA-C 09/20/16 1557    Orbie Pyo, MD 09/21/16 2100

## 2016-10-07 ENCOUNTER — Emergency Department: Payer: Medicaid Other

## 2016-10-07 ENCOUNTER — Emergency Department
Admission: EM | Admit: 2016-10-07 | Discharge: 2016-10-07 | Disposition: A | Discharge status: Home / Self Care | Payer: Medicaid Other | Attending: Emergency Medicine | Admitting: Emergency Medicine

## 2016-10-07 ENCOUNTER — Encounter: Payer: Self-pay | Admitting: Emergency Medicine

## 2016-10-07 DIAGNOSIS — S99912A Unspecified injury of left ankle, initial encounter: Secondary | ICD-10-CM | POA: Diagnosis present

## 2016-10-07 DIAGNOSIS — Z87891 Personal history of nicotine dependence: Secondary | ICD-10-CM | POA: Insufficient documentation

## 2016-10-07 DIAGNOSIS — Y929 Unspecified place or not applicable: Secondary | ICD-10-CM | POA: Insufficient documentation

## 2016-10-07 DIAGNOSIS — Z79899 Other long term (current) drug therapy: Secondary | ICD-10-CM | POA: Insufficient documentation

## 2016-10-07 DIAGNOSIS — Y9389 Activity, other specified: Secondary | ICD-10-CM | POA: Insufficient documentation

## 2016-10-07 DIAGNOSIS — Y999 Unspecified external cause status: Secondary | ICD-10-CM | POA: Diagnosis not present

## 2016-10-07 DIAGNOSIS — J45909 Unspecified asthma, uncomplicated: Secondary | ICD-10-CM | POA: Diagnosis not present

## 2016-10-07 DIAGNOSIS — S93402A Sprain of unspecified ligament of left ankle, initial encounter: Secondary | ICD-10-CM | POA: Diagnosis not present

## 2016-10-07 DIAGNOSIS — W109XXA Fall (on) (from) unspecified stairs and steps, initial encounter: Secondary | ICD-10-CM | POA: Diagnosis not present

## 2016-10-07 MED ORDER — NAPROXEN 500 MG PO TABS: 500.0000 mg | ORAL_TABLET | Freq: Two times a day (BID) | ORAL | Status: DC

## 2016-10-07 MED ORDER — NAPROXEN 500 MG PO TABS
500.0000 mg | ORAL_TABLET | Freq: Once | ORAL | Status: AC
  Administered 2016-10-07: 500 mg via ORAL
  Filled 2016-10-07: qty 1

## 2016-10-07 NOTE — ED Notes (Signed)
See triage note  States she missed a step this am   Fell twisted left ankle  Min swelling noted   Positive pulses  No deformity

## 2016-10-07 NOTE — ED Provider Notes (Signed)
[facility] Emergency Department Provider Note   ____________________________________________   First MD Initiated Contact with Patient 10/07/16 0805     (approximate)  I have reviewed the triage vital signs and the nursing notes.   HISTORY  Chief Complaint Ankle Pain    HPI PICCOLA ARICO is a 21 y.o. female patient complaining of the fact pain secondary to a trip and fall coming down some stairs this morning. Patient state increased pain weightbearing. Patient denies loss sensation or loss of motion of the left foot and ankle. Patient rates pain as a 9/10. Patient And his "achy". No palliative measures prior to arrival. Patient is given ice pack and triaged.   Past Medical History:  Diagnosis Date  . Anemia affecting pregnancy in second trimester, antepartum   . Asthma   . Headache 21 years old    Patient Active Problem List   Diagnosis Date Noted  . Postpartum care following vaginal delivery 09/11/2015  . Labor and delivery indication for care or intervention 09/10/2015  . Decreased fetal movement 09/07/2015  . Irregular uterine contractions 09/01/2015  . Irregular contractions 09/01/2015  . Abdominal pain affecting pregnancy 08/25/2015  . Indication for care in labor and delivery, antepartum 08/08/2015  . Labor and delivery, indication for care 07/10/2015  . Preterm labor 06/12/2015  . Pelvic pressure in pregnancy, antepartum 06/11/2015    Past Surgical History:  Procedure Laterality Date  . NO PAST SURGERIES      Prior to Admission medications   Medication Sig Start Date End Date Taking? Authorizing Provider  albuterol (PROVENTIL HFA;VENTOLIN HFA) 108 (90 BASE) MCG/ACT inhaler Inhale 2 puffs into the lungs every 6 (six) hours as needed for wheezing or shortness of breath.    Historical Provider, MD  cyclobenzaprine (FLEXERIL) 10 MG tablet Take 1 tablet (10 mg total) by mouth 3 (three) times daily as needed. 09/20/16   Sable Feil,  PA-C  measles, mumps and rubella vaccine (MMR) injection Inject 0.5 mLs into the skin once. 09/12/15   Rod Can, CNM  montelukast (SINGULAIR) 10 MG tablet Take 10 mg by mouth at bedtime.    Historical Provider, MD  naproxen (NAPROSYN) 500 MG tablet Take 1 tablet (500 mg total) by mouth 2 (two) times daily with a meal. 10/07/16   Sable Feil, PA-C  norethindrone (AYGESTIN) 5 MG tablet Take 1 tablet (5 mg total) by mouth daily. At the same time every day 09/23/15   Rod Can, CNM  Prenatal Vit-Fe Fumarate-FA (PRENATAL MULTIVITAMIN) TABS tablet Take 1 tablet by mouth daily at 12 noon.    Historical Provider, MD  traMADol (ULTRAM) 50 MG tablet Take 1 tablet (50 mg total) by mouth every 6 (six) hours as needed for moderate pain. 09/20/16   Sable Feil, PA-C  varicella virus vaccine live (VARIVAX) 1350 PFU/0.5ML INJ injection Inject 0.5 mLs into the skin once. 09/12/15   Rod Can, CNM    Allergies Patient has no known allergies.  Family History  Problem Relation Age of Onset  . Heart disease Father   . Stroke Father     Social History Social History  Substance Use Topics  . Smoking status: Former Research scientist (life sciences)  . Smokeless tobacco: Never Used  . Alcohol use No    Review of Systems Constitutional: No fever/chills Eyes: No visual changes. ENT: No sore throat. Cardiovascular: Denies chest pain. Respiratory: Denies shortness of breath. Gastrointestinal: No abdominal pain.  No nausea, no vomiting.  No diarrhea.  No  constipation. Genitourinary: Negative for dysuria. Musculoskeletal: Negative for back pain. Skin: Negative for rash. Neurological: Negative for headaches, focal weakness or numbness.    ____________________________________________   PHYSICAL EXAM:  VITAL SIGNS: ED Triage Vitals [10/07/16 0755]  Enc Vitals Group     BP 107/75     Pulse Rate 65     Resp 18     Temp 98.3 F (36.8 C)     Temp Source Oral     SpO2 97 %     Weight 175 lb (79.4 kg)     Height 5'  1" (1.549 m)     Head Circumference      Peak Flow      Pain Score 9     Pain Loc      Pain Edu?      Excl. in [place]?     Constitutional: Alert and oriented. Well appearing and in no acute distress. Eyes: Conjunctivae are normal. PERRL. EOMI. Head: Atraumatic. Nose: No congestion/rhinnorhea. Mouth/Throat: Mucous membranes are moist.  Oropharynx non-erythematous. Neck: No stridor.  No cervical spine tenderness to palpation. Hematological/Lymphatic/Immunilogical: No cervical lymphadenopathy. Cardiovascular: Normal rate, regular rhythm. Grossly normal heart sounds.  Good peripheral circulation. Respiratory: Normal respiratory effort.  No retractions. Lungs CTAB. Gastrointestinal: Soft and nontender. No distention. No abdominal bruits. No CVA tenderness. Musculoskeletal: See deformity or edema to the left ankle. Patient has mild guarding palpation of the lateral malleolus. Neurologic:  Normal speech and language. No gross focal neurologic deficits are appreciated. No gait instability. Skin:  Skin is warm, dry and intact. No rash noted. Psychiatric: Mood and affect are normal. Speech and behavior are normal.  ____________________________________________   LABS (all labs ordered are listed, but only abnormal results are displayed)  Labs Reviewed - No data to display ____________________________________________  EKG   ____________________________________________  RADIOLOGY  No acute findings x-ray of the left ankle. ____________________________________________   PROCEDURES  Procedure(s) performed: None  Procedures  Critical Care performed: No  ____________________________________________   INITIAL IMPRESSION / ASSESSMENT AND PLAN / ED COURSE  Pertinent labs & imaging results that were available during my care of the patient were reviewed by me and considered in my medical decision making (see chart for details).  Left ankle sprain. Patient pending ankle x-ray. Patient  declined pain medication at this time.      ____________________________________________   FINAL CLINICAL IMPRESSION(S) / ED DIAGNOSES  Final diagnoses:  Sprain of left ankle, unspecified ligament, initial encounter  Left ankle sprain. Patient given discharge care instructions. Patient placed in ankle stirrup splint. Patient given prescription for naproxen.    NEW MEDICATIONS STARTED DURING THIS VISIT:  New Prescriptions   NAPROXEN (NAPROSYN) 500 MG TABLET    Take 1 tablet (500 mg total) by mouth 2 (two) times daily with a meal.     Note:  This document was prepared using Dragon voice recognition software and may include unintentional dictation errors.    Sable Feil, PA-C 10/07/16 9977    Lavonia Drafts, MD 10/09/16 1010

## 2016-10-07 NOTE — ED Triage Notes (Signed)
Patient presents to the ED with left ankle pain after tripping down some steps this am.  Patient states it is difficult to bear weight on her left leg.  Patient is in no obvious distress at this time.  Sensation and mobility are intact to left foot.

## 2017-01-08 ENCOUNTER — Emergency Department
Admission: EM | Admit: 2017-01-08 | Discharge: 2017-01-08 | Disposition: A | Discharge status: Home / Self Care | Payer: Medicaid Other | Attending: Emergency Medicine | Admitting: Emergency Medicine

## 2017-01-08 ENCOUNTER — Emergency Department: Payer: Medicaid Other

## 2017-01-08 ENCOUNTER — Encounter: Payer: Self-pay | Admitting: *Deleted

## 2017-01-08 DIAGNOSIS — R1011 Right upper quadrant pain: Secondary | ICD-10-CM | POA: Diagnosis not present

## 2017-01-08 DIAGNOSIS — Z87891 Personal history of nicotine dependence: Secondary | ICD-10-CM | POA: Insufficient documentation

## 2017-01-08 DIAGNOSIS — Z79899 Other long term (current) drug therapy: Secondary | ICD-10-CM | POA: Insufficient documentation

## 2017-01-08 DIAGNOSIS — M940 Chondrocostal junction syndrome [Tietze]: Secondary | ICD-10-CM

## 2017-01-08 DIAGNOSIS — J45909 Unspecified asthma, uncomplicated: Secondary | ICD-10-CM | POA: Insufficient documentation

## 2017-01-08 LAB — URINALYSIS, COMPLETE (UACMP) WITH MICROSCOPIC
BILIRUBIN URINE: NEGATIVE
Bacteria, UA: NONE SEEN
Glucose, UA: NEGATIVE mg/dL
Hgb urine dipstick: NEGATIVE
Ketones, ur: NEGATIVE mg/dL
NITRITE: NEGATIVE
Protein, ur: NEGATIVE mg/dL
SPECIFIC GRAVITY, URINE: 1.023 (ref 1.005–1.030)
pH: 7 (ref 5.0–8.0)

## 2017-01-08 LAB — COMPREHENSIVE METABOLIC PANEL
ALBUMIN: 4.1 g/dL (ref 3.5–5.0)
ALK PHOS: 84 U/L (ref 38–126)
ALT: 13 U/L — ABNORMAL LOW (ref 14–54)
ANION GAP: 7 (ref 5–15)
AST: 15 U/L (ref 15–41)
BILIRUBIN TOTAL: 0.5 mg/dL (ref 0.3–1.2)
BUN: 10 mg/dL (ref 6–20)
CALCIUM: 9.6 mg/dL (ref 8.9–10.3)
CO2: 27 mmol/L (ref 22–32)
Chloride: 106 mmol/L (ref 101–111)
Creatinine, Ser: 0.72 mg/dL (ref 0.44–1.00)
GFR calc Af Amer: >60 mL/min (ref >60)
GLUCOSE: 91 mg/dL (ref 65–99)
Potassium: 3.9 mmol/L (ref 3.5–5.1)
Sodium: 140 mmol/L (ref 135–145)
TOTAL PROTEIN: 7.9 g/dL (ref 6.5–8.1)

## 2017-01-08 LAB — HCG, QUANTITATIVE, PREGNANCY: hCG, Beta Chain, Quant, S: 1 m[IU]/mL (ref <5)

## 2017-01-08 LAB — CBC
HCT: 37.9 % (ref 35.0–47.0)
HEMOGLOBIN: 12.7 g/dL (ref 12.0–16.0)
MCH: 28.9 pg (ref 26.0–34.0)
MCHC: 33.5 g/dL (ref 32.0–36.0)
MCV: 86.5 fL (ref 80.0–100.0)
Platelets: 332 10*3/uL (ref 150–440)
RBC: 4.38 MIL/uL (ref 3.80–5.20)
RDW: 13.9 % (ref 11.5–14.5)
WBC: 7.1 10*3/uL (ref 3.6–11.0)

## 2017-01-08 LAB — LIPASE, BLOOD: Lipase: 24 U/L (ref 11–51)

## 2017-01-08 MED ORDER — KETOROLAC TROMETHAMINE 30 MG/ML IJ SOLN
15.0000 mg | Freq: Once | INTRAMUSCULAR | Status: AC
  Administered 2017-01-08: 15 mg via INTRAVENOUS
  Filled 2017-01-08: qty 1

## 2017-01-08 MED ORDER — NAPROXEN 500 MG PO TABS: 500.0000 mg | ORAL_TABLET | Freq: Two times a day (BID) | ORAL | 0 refills | Status: DC

## 2017-01-08 NOTE — ED Triage Notes (Signed)
Pt to ED reporting RUQ abd pain for the past week that worsens when taking a deep breath. Pt denies NVD, fevers, or bloating. Pt reports pain is constant in nature. No pain upon palpation of area. No cough reported at this time.

## 2017-01-08 NOTE — Discharge Instructions (Signed)
Take the medication prescribed twice a day with meals. Apply warm compresses. Follow-up with your primary care doctor in 24-48 hours if the pain is persistent. Return to the emergency room if you have new or worsening pain, difficulty breathing, fever, cough, or any new symptoms concerning to you.

## 2017-01-08 NOTE — ED Provider Notes (Signed)
[facility] Emergency Department Provider Note  ____________________________________________  Time seen: Approximately 3:05 PM  I have reviewed the triage vital signs and the nursing notes.   HISTORY  Chief Complaint Abdominal Pain   HPI Melinda Barnett is a 21 y.o. female who presents for evaluation of right upper quadrant abdominal pain. Patient reports that the pain initially was intermittent but since yesterday has become constant. She is complaining of 9 out of 10 sharp pain located in her right upper quadrant, worse when she takes a deep breath or when she moves around. The pain is nonradiating. She denies nausea or vomiting. The pain is not worse postprandially. No fever or chills. No constipation or diarrhea. No dysuria or hematuria. No vaginal discharge. No chest pain or shortness of breath. No cough. No prior abdominal surgeries. Patient denies personal or family history blood clots, recent travel or immobilization, leg pain or swelling, hemoptysis, exogenous hormones.  Past Medical History:  Diagnosis Date  . Anemia affecting pregnancy in second trimester, antepartum   . Asthma   . Headache 21 years old    Patient Active Problem List   Diagnosis Date Noted  . Postpartum care following vaginal delivery 09/11/2015  . Labor and delivery indication for care or intervention 09/10/2015  . Decreased fetal movement 09/07/2015  . Irregular uterine contractions 09/01/2015  . Irregular contractions 09/01/2015  . Abdominal pain affecting pregnancy 08/25/2015  . Indication for care in labor and delivery, antepartum 08/08/2015  . Labor and delivery, indication for care 07/10/2015  . Preterm labor 06/12/2015  . Pelvic pressure in pregnancy, antepartum 06/11/2015    Past Surgical History:  Procedure Laterality Date  . NO PAST SURGERIES      Prior to Admission medications   Medication Sig Start Date End Date Taking? Authorizing Provider  albuterol  (PROVENTIL HFA;VENTOLIN HFA) 108 (90 BASE) MCG/ACT inhaler Inhale 2 puffs into the lungs every 6 (six) hours as needed for wheezing or shortness of breath.    [provider]  cyclobenzaprine (FLEXERIL) 10 MG tablet Take 1 tablet (10 mg total) by mouth 3 (three) times daily as needed. 09/20/16   Sable Feil, PA-C  measles, mumps and rubella vaccine (MMR) injection Inject 0.5 mLs into the skin once. 09/12/15   Rod Can, CNM  montelukast (SINGULAIR) 10 MG tablet Take 10 mg by mouth at bedtime.    [provider]  naproxen (NAPROSYN) 500 MG tablet Take 1 tablet (500 mg total) by mouth 2 (two) times daily with a meal. 01/08/17 01/08/18  Alfred Levins, [place], MD  norethindrone (AYGESTIN) 5 MG tablet Take 1 tablet (5 mg total) by mouth daily. At the same time every day 09/23/15   Rod Can, CNM  Prenatal Vit-Fe Fumarate-FA (PRENATAL MULTIVITAMIN) TABS tablet Take 1 tablet by mouth daily at 12 noon.    [provider]  traMADol (ULTRAM) 50 MG tablet Take 1 tablet (50 mg total) by mouth every 6 (six) hours as needed for moderate pain. 09/20/16   Sable Feil, PA-C  varicella virus vaccine live (VARIVAX) 1350 PFU/0.5ML INJ injection Inject 0.5 mLs into the skin once. 09/12/15   Rod Can, CNM    Allergies Patient has no known allergies.  Family History  Problem Relation Age of Onset  . Heart disease Father   . Stroke Father     Social History Social History  Substance Use Topics  . Smoking status: Former Research scientist (life sciences)  . Smokeless tobacco: Never Used  . Alcohol use  No    Review of Systems  Constitutional: Negative for fever. Eyes: Negative for visual changes. ENT: Negative for sore throat. Neck: No neck pain  Cardiovascular: Negative for chest pain. Respiratory: Negative for shortness of breath. Gastrointestinal: + RUQ abdominal pain. No vomiting or diarrhea. Genitourinary: Negative for dysuria. Musculoskeletal: Negative for back pain. Skin: Negative for  rash. Neurological: Negative for headaches, weakness or numbness. Psych: No SI or HI  ____________________________________________   PHYSICAL EXAM:  VITAL SIGNS: ED Triage Vitals  Enc Vitals Group     BP 01/08/17 1249 125/68     Pulse Rate 01/08/17 1249 64     Resp 01/08/17 1249 16     Temp 01/08/17 1252 98.4 F (36.9 C)     Temp Source 01/08/17 1249 Oral     SpO2 01/08/17 1249 100 %     Weight 01/08/17 1249 175 lb (79.4 kg)     Height 01/08/17 1249 _0  (1.549 m)     Head Circumference --      Peak Flow --      Pain Score 01/08/17 1249 8     Pain Loc --      Pain Edu? --      Excl. in [place]? --     Constitutional: Alert and oriented. Well appearing and in no apparent distress. HEENT:      Head: Normocephalic and atraumatic.         Eyes: Conjunctivae are normal. Sclera is non-icteric.       Mouth/Throat: Mucous membranes are moist.       Neck: Supple with no signs of meningismus. Cardiovascular: Regular rate and rhythm. No murmurs, gallops, or rubs. 2+ symmetrical distal pulses are present in all extremities. No JVD. Respiratory: Normal respiratory effort. Lungs are clear to auscultation bilaterally. No wheezes, crackles, or rhonchi.  Gastrointestinal: Soft, ttp over the RUQ with positive Murphy's sign, and non distended with positive bowel sounds. No rebound or guarding. Genitourinary: No CVA tenderness. Musculoskeletal: Nontender with normal range of motion in all extremities. No edema, cyanosis, or erythema of extremities. Neurologic: Normal speech and language. Face is symmetric. Moving all extremities. No gross focal neurologic deficits are appreciated. Skin: Skin is warm, dry and intact. No rash noted. Psychiatric: Mood and affect are normal. Speech and behavior are normal.  ____________________________________________   LABS (all labs ordered are listed, but only abnormal results are displayed)  Labs Reviewed  COMPREHENSIVE METABOLIC PANEL - Abnormal; Notable  for the following:       Result Value   ALT 13 (*)    All other components within normal limits  URINALYSIS, COMPLETE (UACMP) WITH MICROSCOPIC - Abnormal; Notable for the following:    Color, Urine YELLOW (*)    APPearance HAZY (*)    Leukocytes, UA MODERATE (*)    Squamous Epithelial / LPF 6-30 (*)    All other components within normal limits  LIPASE, BLOOD  CBC  HCG, QUANTITATIVE, PREGNANCY   ____________________________________________  EKG  none  ____________________________________________  RADIOLOGY  RUQ [place]: No gallstones or wall thickening visualized. No sonographic Murphy sign noted by sonographer. ____________________________________________   PROCEDURES  Procedure(s) performed: None Procedures Critical Care performed:  None ____________________________________________   INITIAL IMPRESSION / ASSESSMENT AND PLAN / ED COURSE   21 y.o. female who presents for evaluation of right upper quadrant abdominal pain x 1 week. Patient is well-appearing, in no distress, is normal vital signs, she has a soft abdomen with tenderness to palpation on the right  upper quadrant and positive Murphy sign, no rebound or guarding. Blood work showing normal CBC, CMP, lipase. UA with of UTI. Pregnancy test is pending to rule out pregnancy complications such as ectopic. PERC negative for PE. Will get [place] to eval for GB pathology. Will treat pain with toradol.  Clinical Course as of Jan 09 1603  Thu Jan 08, 2017  1554 CBC, CMP, lipase, UA, right upper quadrant ultrasound all within normal limits. Pain is markedly improved with IV Toradol. At this time I believe patient's diagnosis is due to costochondritis. We'll discharge home on NSAIDs and close follow-up with primary care doctor. Discussed return precautions with patient for any signs of fever, shortness of breath, worsening pain, or new symptoms and recommended she returns if these develop.  [CV]    Clinical Course User Index [CV]  Rudene Re, MD    Pertinent labs & imaging results that were available during my care of the patient were reviewed by me and considered in my medical decision making (see chart for details).    ____________________________________________   FINAL CLINICAL IMPRESSION(S) / ED DIAGNOSES  Final diagnoses:  RUQ abdominal pain  Costochondritis, acute      NEW MEDICATIONS STARTED DURING THIS VISIT:  New Prescriptions   NAPROXEN (NAPROSYN) 500 MG TABLET    Take 1 tablet (500 mg total) by mouth 2 (two) times daily with a meal.     Note:  This document was prepared using Dragon voice recognition software and may include unintentional dictation errors.    Alfred Levins, [place], MD 01/08/17 [phone]

## 2017-01-11 ENCOUNTER — Emergency Department
Admission: EM | Admit: 2017-01-11 | Discharge: 2017-01-11 | Disposition: A | Discharge status: Home / Self Care | Payer: Medicaid Other | Attending: Emergency Medicine | Admitting: Emergency Medicine

## 2017-01-11 ENCOUNTER — Emergency Department: Payer: Medicaid Other

## 2017-01-11 ENCOUNTER — Encounter: Payer: Self-pay | Admitting: Medical Oncology

## 2017-01-11 DIAGNOSIS — M940 Chondrocostal junction syndrome [Tietze]: Secondary | ICD-10-CM | POA: Diagnosis not present

## 2017-01-11 DIAGNOSIS — J45909 Unspecified asthma, uncomplicated: Secondary | ICD-10-CM | POA: Insufficient documentation

## 2017-01-11 DIAGNOSIS — Z7951 Long term (current) use of inhaled steroids: Secondary | ICD-10-CM | POA: Insufficient documentation

## 2017-01-11 DIAGNOSIS — K529 Noninfective gastroenteritis and colitis, unspecified: Secondary | ICD-10-CM

## 2017-01-11 DIAGNOSIS — R1011 Right upper quadrant pain: Secondary | ICD-10-CM | POA: Diagnosis present

## 2017-01-11 LAB — FIBRIN DERIVATIVES D-DIMER (ARMC ONLY): FIBRIN DERIVATIVES D-DIMER (ARMC): 6007.05 — AB (ref 0.00–499.00)

## 2017-01-11 LAB — COMPREHENSIVE METABOLIC PANEL
ALBUMIN: 3.9 g/dL (ref 3.5–5.0)
ALK PHOS: 87 U/L (ref 38–126)
ALT: 11 U/L — ABNORMAL LOW (ref 14–54)
AST: 16 U/L (ref 15–41)
Anion gap: 5 (ref 5–15)
BILIRUBIN TOTAL: 0.5 mg/dL (ref 0.3–1.2)
BUN: 11 mg/dL (ref 6–20)
CALCIUM: 9.3 mg/dL (ref 8.9–10.3)
CO2: 25 mmol/L (ref 22–32)
Chloride: 108 mmol/L (ref 101–111)
Creatinine, Ser: 0.69 mg/dL (ref 0.44–1.00)
GFR calc Af Amer: >60 mL/min (ref >60)
GFR calc non Af Amer: >60 mL/min (ref >60)
GLUCOSE: 93 mg/dL (ref 65–99)
Potassium: 4 mmol/L (ref 3.5–5.1)
SODIUM: 138 mmol/L (ref 135–145)
TOTAL PROTEIN: 7.5 g/dL (ref 6.5–8.1)

## 2017-01-11 LAB — URINALYSIS, COMPLETE (UACMP) WITH MICROSCOPIC
BACTERIA UA: NONE SEEN
Bilirubin Urine: NEGATIVE
Glucose, UA: NEGATIVE mg/dL
HGB URINE DIPSTICK: NEGATIVE
Ketones, ur: NEGATIVE mg/dL
NITRITE: NEGATIVE
PROTEIN: NEGATIVE mg/dL
SPECIFIC GRAVITY, URINE: 1.021 (ref 1.005–1.030)
pH: 6 (ref 5.0–8.0)

## 2017-01-11 LAB — CBC
HEMATOCRIT: 37.4 % (ref 35.0–47.0)
Hemoglobin: 12.5 g/dL (ref 12.0–16.0)
MCH: 29.3 pg (ref 26.0–34.0)
MCHC: 33.3 g/dL (ref 32.0–36.0)
MCV: 87.8 fL (ref 80.0–100.0)
Platelets: 349 10*3/uL (ref 150–440)
RBC: 4.26 MIL/uL (ref 3.80–5.20)
RDW: 13.7 % (ref 11.5–14.5)
WBC: 7.1 10*3/uL (ref 3.6–11.0)

## 2017-01-11 LAB — POCT PREGNANCY, URINE: PREG TEST UR: NEGATIVE

## 2017-01-11 LAB — LIPASE, BLOOD: Lipase: 28 U/L (ref 11–51)

## 2017-01-11 MED ORDER — ONDANSETRON 4 MG PO TBDP: 4.0000 mg | ORAL_TABLET | Freq: Three times a day (TID) | ORAL | 0 refills | Status: DC | PRN

## 2017-01-11 MED ORDER — NAPROXEN 500 MG PO TABS: 500.0000 mg | ORAL_TABLET | Freq: Two times a day (BID) | ORAL | 0 refills | Status: DC

## 2017-01-11 MED ORDER — KETOROLAC TROMETHAMINE 30 MG/ML IJ SOLN
15.0000 mg | Freq: Once | INTRAMUSCULAR | Status: AC
  Administered 2017-01-11: 15 mg via INTRAVENOUS
  Filled 2017-01-11: qty 1

## 2017-01-11 MED ORDER — IOPAMIDOL (ISOVUE-370) INJECTION 76%
75.0000 mL | Freq: Once | INTRAVENOUS | Status: AC | PRN
  Administered 2017-01-11: 75 mL via INTRAVENOUS

## 2017-01-11 NOTE — Consult Note (Signed)
Patient ID: Melinda Barnett, female   DOB: 11-22-1995, 21 y.o.   MRN: [id number]  HPI CONTINA STRAIN is a 21 y.o. female asked to see in consultation by Dr. Alfred Levins. Patient reports having right-sided chest wall pain for last 5 days and some epigastric pain. She reports the pain is moderate-to-severe intensity, intermittent and sharp in nature. Pain worsens with deep breaths. No fevers no chills. Normal appetite. No emesis. Some nausea and she does have diarrhea. She came to emergency room 3 days ago and workup was negative.  Workup included an ultrasound that I have personally reviewed there was no evidence of stones no evidence of cholecystitis. Normal common bile duct. Today she had a CT scan of the abdomen and pelvis that I have personally reviewed there is some minor and thickening of the mesentery around the right upper quadrant. No evidence of perforation no evidence of free air or abscess. Evidence of wall thickening of either due to small bowel or the gallbladder. Appendix is normal, no abscess. Her white count is normal and her CMP is normal.  HPI  Past Medical History:  Diagnosis Date  . Anemia affecting pregnancy in second trimester, antepartum   . Asthma   . Headache 21 years old    Past Surgical History:  Procedure Laterality Date  . NO PAST SURGERIES      Family History  Problem Relation Age of Onset  . Heart disease Father   . Stroke Father     Social History Social History  Substance Use Topics  . Smoking status: Former Research scientist (life sciences)  . Smokeless tobacco: Never Used  . Alcohol use No    No Known Allergies  No current facility-administered medications for this encounter.    Current Outpatient Prescriptions  Medication Sig Dispense Refill  . albuterol (PROVENTIL HFA;VENTOLIN HFA) 108 (90 BASE) MCG/ACT inhaler Inhale 2 puffs into the lungs every 6 (six) hours as needed for wheezing or shortness of breath.    . naproxen (NAPROSYN) 500 MG tablet Take 1 tablet (500 mg  total) by mouth 2 (two) times daily with a meal. 20 tablet 0  . cyclobenzaprine (FLEXERIL) 10 MG tablet Take 1 tablet (10 mg total) by mouth 3 (three) times daily as needed. (Patient not taking: Reported on 01/11/2017) 15 tablet 0  . measles, mumps and rubella vaccine (MMR) injection Inject 0.5 mLs into the skin once. (Patient not taking: Reported on 01/11/2017) 1 each 0  . naproxen (NAPROSYN) 500 MG tablet Take 1 tablet (500 mg total) by mouth 2 (two) times daily with a meal. 20 tablet 0  . norethindrone (AYGESTIN) 5 MG tablet Take 1 tablet (5 mg total) by mouth daily. At the same time every day (Patient not taking: Reported on 01/11/2017) 28 tablet 2  . ondansetron (ZOFRAN ODT) 4 MG disintegrating tablet Take 1 tablet (4 mg total) by mouth every 8 (eight) hours as needed for nausea or vomiting. 20 tablet 0  . traMADol (ULTRAM) 50 MG tablet Take 1 tablet (50 mg total) by mouth every 6 (six) hours as needed for moderate pain. (Patient not taking: Reported on 01/11/2017) 12 tablet 0  . varicella virus vaccine live (VARIVAX) 1350 PFU/0.5ML INJ injection Inject 0.5 mLs into the skin once. (Patient not taking: Reported on 01/11/2017) 1 each 0     Review of Systems Full ROS  was asked and was negative except for the information on the HPI  Physical Exam Blood pressure 101/65, pulse (!) 54, temperature 98.4 F (36.9  C), temperature source Oral, resp. rate 16, height 5' 1"  (1.549 m), weight 79.4 kg (175 lb), last menstrual period 12/30/2016, SpO2 100 %, unknown if currently breastfeeding. CONSTITUTIONAL: NAD EYES: Pupils are equal, round, and reactive to light, Sclera are non-icteric. EARS, NOSE, MOUTH AND THROAT: The oropharynx is clear. The oral mucosa is pink and moist. Hearing is intact to voice. LYMPH NODES:  Lymph nodes in the neck are normal. RESPIRATORY:  Lungs are clear. There is normal respiratory effort, with equal breath sounds bilaterally, and without pathologic use of accessory  muscles. CARDIOVASCULAR: Heart is regular without murmurs, gallops, or rubs. GI: The abdomen is  soft and nondistended. There are no palpable masses. There is no hepatosplenomegaly. There are normal bowel sounds in all quadrants. Mild TTP epigastric area, main area of tenderness is right sided chest wall. No peritonitis GU: Rectal deferred.   MUSCULOSKELETAL: Normal muscle strength and tone. No cyanosis or edema.   SKIN: Turgor is good and there are no pathologic skin lesions or ulcers. NEUROLOGIC: Motor and sensation is grossly normal. Cranial nerves are grossly intact. PSYCH:  Oriented to person, place and time. Affect is normal.  Data Reviewed  I have personally reviewed the patient's imaging, laboratory findings and medical records.    Assessment Plan Right-sided chest wall pain and mild epigastric pain consistent either with intercostal neuralgia or enteritis. There is no evidence of appendicitis or acute abdomen at this time. There is no evidence of any intra-abdominal surgical issues. Discussed with the patient and with Dr. Alfred Levins detail. She knows to call the office back if she continues to have issues. 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 Recommend and close follow-up for enteritis. No need for surgical intervention at this time.  Caroleen Hamman, MD FACS General Surgeon 01/11/2017, 3:57 PM

## 2017-01-11 NOTE — ED Provider Notes (Signed)
[facility] Emergency Department Provider Note  ____________________________________________  Time seen: Approximately 1:17 PM  I have reviewed the triage vital signs and the nursing notes.   HISTORY  Chief Complaint Abdominal Pain   HPI Melinda Barnett is a 21 y.o. female who presents for evaluation of right upper quadrant abdominal pain. Patient was seen here 3 days ago for similar complaint. Had normal blood work including CBC, CMP, lipase, UA, and urine pregnancy. She had a right upper quadrant ultrasound which was negative and she was discharged home with a diagnosis of costochondritis on NSAIDs. She reports that the pain has been persistent and now for the last2-3 days she started having several daily episodes of diarrhea with no blood or melena, nausea, but no vomiting. No chest pain or shortness of breath. She is complaining of constant severe sharp pain in her right upper quadrant that is worse when she moves around and takes a deep breath. No recent antibiotic use or history of C. difficile.  Past Medical History:  Diagnosis Date  . Anemia affecting pregnancy in second trimester, antepartum   . Asthma   . Headache 21 years old    Patient Active Problem List   Diagnosis Date Noted  . Postpartum care following vaginal delivery 09/11/2015  . Labor and delivery indication for care or intervention 09/10/2015  . Decreased fetal movement 09/07/2015  . Irregular uterine contractions 09/01/2015  . Irregular contractions 09/01/2015  . Abdominal pain affecting pregnancy 08/25/2015  . Indication for care in labor and delivery, antepartum 08/08/2015  . Labor and delivery, indication for care 07/10/2015  . Preterm labor 06/12/2015  . Pelvic pressure in pregnancy, antepartum 06/11/2015    Past Surgical History:  Procedure Laterality Date  . NO PAST SURGERIES      Prior to Admission medications   Medication Sig Start Date End Date Taking? Authorizing  Provider  albuterol (PROVENTIL HFA;VENTOLIN HFA) 108 (90 BASE) MCG/ACT inhaler Inhale 2 puffs into the lungs every 6 (six) hours as needed for wheezing or shortness of breath.   Yes [provider]  naproxen (NAPROSYN) 500 MG tablet Take 1 tablet (500 mg total) by mouth 2 (two) times daily with a meal. 01/08/17 01/08/18 Yes Hiedi Touchton, [place], MD  cyclobenzaprine (FLEXERIL) 10 MG tablet Take 1 tablet (10 mg total) by mouth 3 (three) times daily as needed. Patient not taking: Reported on 01/11/2017 09/20/16   Sable Feil, PA-C  measles, mumps and rubella vaccine (MMR) injection Inject 0.5 mLs into the skin once. Patient not taking: Reported on 01/11/2017 09/12/15   Rod Can, CNM  naproxen (NAPROSYN) 500 MG tablet Take 1 tablet (500 mg total) by mouth 2 (two) times daily with a meal. 01/11/17 01/11/18  Alfred Levins, [place], MD  norethindrone (AYGESTIN) 5 MG tablet Take 1 tablet (5 mg total) by mouth daily. At the same time every day Patient not taking: Reported on 01/11/2017 09/23/15   Rod Can, CNM  ondansetron (ZOFRAN ODT) 4 MG disintegrating tablet Take 1 tablet (4 mg total) by mouth every 8 (eight) hours as needed for nausea or vomiting. 01/11/17   Rudene Re, MD  traMADol (ULTRAM) 50 MG tablet Take 1 tablet (50 mg total) by mouth every 6 (six) hours as needed for moderate pain. Patient not taking: Reported on 01/11/2017 09/20/16   Sable Feil, PA-C  varicella virus vaccine live (VARIVAX) 1350 PFU/0.5ML INJ injection Inject 0.5 mLs into the skin once. Patient not taking: Reported on 01/11/2017 09/12/15  Rod Can, CNM    Allergies Patient has no known allergies.  Family History  Problem Relation Age of Onset  . Heart disease Father   . Stroke Father     Social History Social History  Substance Use Topics  . Smoking status: Former Research scientist (life sciences)  . Smokeless tobacco: Never Used  . Alcohol use No    Review of Systems  Constitutional: Negative for fever. Eyes: Negative for  visual changes. ENT: Negative for sore throat. Neck: No neck pain  Cardiovascular: Negative for chest pain. Respiratory: Negative for shortness of breath. Gastrointestinal: + RUQ abdominal pain, nausea, and diarrhea. No vomiting Genitourinary: Negative for dysuria. Musculoskeletal: Negative for back pain. Skin: Negative for rash. Neurological: Negative for headaches, weakness or numbness. Psych: No SI or HI  ____________________________________________   PHYSICAL EXAM:  VITAL SIGNS: ED Triage Vitals  Enc Vitals Group     BP 01/11/17 1125 105/71     Pulse Rate 01/11/17 1125 62     Resp 01/11/17 1125 16     Temp 01/11/17 1125 98.4 F (36.9 C)     Temp Source 01/11/17 1125 Oral     SpO2 01/11/17 1125 98 %     Weight 01/11/17 1123 175 lb (79.4 kg)     Height 01/11/17 1123 5' 1"  (1.549 m)     Head Circumference --      Peak Flow --      Pain Score 01/11/17 1123 8     Pain Loc --      Pain Edu? --      Excl. in [place]? --     Constitutional: Alert and oriented. Well appearing and in no apparent distress. HEENT:      Head: Normocephalic and atraumatic.         Eyes: Conjunctivae are normal. Sclera is non-icteric.       Mouth/Throat: Mucous membranes are moist.       Neck: Supple with no signs of meningismus. Cardiovascular: Regular rate and rhythm. No murmurs, gallops, or rubs. 2+ symmetrical distal pulses are present in all extremities. No JVD. Respiratory: Normal respiratory effort. Lungs are clear to auscultation bilaterally. No wheezes, crackles, or rhonchi.  Gastrointestinal: Soft, ttp over the RUQ, non distended with positive bowel sounds. No rebound or guarding. Genitourinary: No CVA tenderness. Musculoskeletal: Nontender with normal range of motion in all extremities. No edema, cyanosis, or erythema of extremities. Neurologic: Normal speech and language. Face is symmetric. Moving all extremities. No gross focal neurologic deficits are appreciated. Skin: Skin is warm, dry  and intact. No rash noted. Psychiatric: Mood and affect are normal. Speech and behavior are normal.  ____________________________________________   LABS (all labs ordered are listed, but only abnormal results are displayed)  Labs Reviewed  COMPREHENSIVE METABOLIC PANEL - Abnormal; Notable for the following:       Result Value   ALT 11 (*)    All other components within normal limits  URINALYSIS, COMPLETE (UACMP) WITH MICROSCOPIC - Abnormal; Notable for the following:    Color, Urine YELLOW (*)    APPearance HAZY (*)    Leukocytes, UA TRACE (*)    Squamous Epithelial / LPF 6-30 (*)    All other components within normal limits  FIBRIN DERIVATIVES D-DIMER ([facility] ONLY) - Abnormal; Notable for the following:    Fibrin derivatives D-dimer ([facility]) 6,007.05 (*)    All other components within normal limits  LIPASE, BLOOD  CBC  POC URINE PREG, ED  POCT PREGNANCY, URINE   ____________________________________________  EKG  none  ____________________________________________  RADIOLOGY  CT c/a/p:  1. No CT evidence for acute pulmonary embolus. No acute findings in the chest. 2. The patient has mesenteric and omental edema/inflammation and tiny pockets of interloop mesenteric fluid in the small bowel mesentery with a slight right abdominal predominance. There is a small amount of fluid also evident the cul-de-sac. There is no small bowel wall thickening and the terminal ileum is unremarkable. The appendix is upper normal in diameter and the tip is positioned near some of the interloop fluid, but the appendix does not appear to represent the epicenter of these changes. While not definite, diffuse enteritis potentially would be a consideration. There is no evidence of bowel obstruction or bowel ischemia. No intraperitoneal abscess. ____________________________________________   PROCEDURES  Procedure(s) performed: None Procedures Critical Care performed:   None ____________________________________________   INITIAL IMPRESSION / ASSESSMENT AND PLAN / ED COURSE  21 y.o. female who presents for evaluation of ongoing right upper quadrant abdominal pain now with nausea and diarrhea. Patient with negative workup done 3 days ago including right upper quadrant ultrasound, blood and urine. And now has new symptoms which include diarrhea and nausea however her abdominal exam shows tenderness only on the right upper quadrant with negative Murphy sign. We'll check a d-dimer even though patient is PERC negative. Plan for CT a/p. Labs with no acute findings. Will treat symptoms with toradol and zofram  Clinical Course as of Jan 12 1556  Sun Jan 11, 2017  1542 CTA of the chest negative for PE. CT concerning for diffuse inflammation of the small bowel. Appendix is upper limit of normal but it is located near the inflammatory changes seen on CT therefore I consulted Dr. Dahlia Byes, surgery to evaluate patient to make sure patient does not have appendicitis. He will look at the CT and evaluate patient.   [CV]  [id number] Patient evaluated by Dr. Dahlia Byes, surgery on call who looked at CT and examined patient and agrees that patient has no clinical or imaging suspicion of appendicitis. Patient will be dc home on naprosyn, zofran, and close f/u with PCP. Discussed return precautions with patient for signs of worsening infection such as fever, dehydration, worsening pain and recommended that she return if these develop.  [CV]    Clinical Course User Index [CV] Rudene Re, MD    Pertinent labs & imaging results that were available during my care of the patient were reviewed by me and considered in my medical decision making (see chart for details).    ____________________________________________   FINAL CLINICAL IMPRESSION(S) / ED DIAGNOSES  Final diagnoses:  Enteritis      NEW MEDICATIONS STARTED DURING THIS VISIT:  New Prescriptions   NAPROXEN (NAPROSYN) 500  MG TABLET    Take 1 tablet (500 mg total) by mouth 2 (two) times daily with a meal.   ONDANSETRON (ZOFRAN ODT) 4 MG DISINTEGRATING TABLET    Take 1 tablet (4 mg total) by mouth every 8 (eight) hours as needed for nausea or vomiting.     Note:  This document was prepared using Dragon voice recognition software and may include unintentional dictation errors.    Alfred Levins, [place], MD 01/11/17 [phone]

## 2017-01-11 NOTE — ED Triage Notes (Signed)
Pt reports that for over a week she has been having RUQ abd pain, pt was seen here Thursday for same. Pt reports diarrhea. Pain worsens with inhalation.

## 2017-07-13 ENCOUNTER — Emergency Department: Payer: Self-pay

## 2017-07-13 ENCOUNTER — Encounter: Payer: Self-pay | Admitting: Intensive Care

## 2017-07-13 ENCOUNTER — Emergency Department
Admission: EM | Admit: 2017-07-13 | Discharge: 2017-07-13 | Disposition: A | Discharge status: Home / Self Care | Payer: Self-pay | Attending: Emergency Medicine | Admitting: Emergency Medicine

## 2017-07-13 DIAGNOSIS — J45909 Unspecified asthma, uncomplicated: Secondary | ICD-10-CM | POA: Insufficient documentation

## 2017-07-13 DIAGNOSIS — R1011 Right upper quadrant pain: Secondary | ICD-10-CM | POA: Insufficient documentation

## 2017-07-13 DIAGNOSIS — Z87891 Personal history of nicotine dependence: Secondary | ICD-10-CM | POA: Insufficient documentation

## 2017-07-13 DIAGNOSIS — Z79899 Other long term (current) drug therapy: Secondary | ICD-10-CM | POA: Insufficient documentation

## 2017-07-13 DIAGNOSIS — R110 Nausea: Secondary | ICD-10-CM | POA: Insufficient documentation

## 2017-07-13 DIAGNOSIS — R52 Pain, unspecified: Secondary | ICD-10-CM

## 2017-07-13 LAB — COMPREHENSIVE METABOLIC PANEL
ALBUMIN: 3.9 g/dL (ref 3.5–5.0)
ALT: 21 U/L (ref 14–54)
AST: 19 U/L (ref 15–41)
Alkaline Phosphatase: 70 U/L (ref 38–126)
Anion gap: 6 (ref 5–15)
BUN: 12 mg/dL (ref 6–20)
CHLORIDE: 106 mmol/L (ref 101–111)
CO2: 24 mmol/L (ref 22–32)
CREATININE: 0.76 mg/dL (ref 0.44–1.00)
Calcium: 8.9 mg/dL (ref 8.9–10.3)
GFR calc Af Amer: >60 mL/min (ref >60)
GFR calc non Af Amer: >60 mL/min (ref >60)
Glucose, Bld: 92 mg/dL (ref 65–99)
POTASSIUM: 3.7 mmol/L (ref 3.5–5.1)
SODIUM: 136 mmol/L (ref 135–145)
Total Bilirubin: 0.4 mg/dL (ref 0.3–1.2)
Total Protein: 6.9 g/dL (ref 6.5–8.1)

## 2017-07-13 LAB — CBC
HEMATOCRIT: 38.5 % (ref 35.0–47.0)
Hemoglobin: 12.6 g/dL (ref 12.0–16.0)
MCH: 27.9 pg (ref 26.0–34.0)
MCHC: 32.7 g/dL (ref 32.0–36.0)
MCV: 85.4 fL (ref 80.0–100.0)
Platelets: 267 10*3/uL (ref 150–440)
RBC: 4.51 MIL/uL (ref 3.80–5.20)
RDW: 15.6 % — AB (ref 11.5–14.5)
WBC: 5 10*3/uL (ref 3.6–11.0)

## 2017-07-13 LAB — BILIRUBIN, DIRECT

## 2017-07-13 LAB — URINALYSIS, COMPLETE (UACMP) WITH MICROSCOPIC
BACTERIA UA: NONE SEEN
BILIRUBIN URINE: NEGATIVE
Glucose, UA: NEGATIVE mg/dL
HGB URINE DIPSTICK: NEGATIVE
Ketones, ur: NEGATIVE mg/dL
Nitrite: NEGATIVE
PROTEIN: NEGATIVE mg/dL
Specific Gravity, Urine: 1.019 (ref 1.005–1.030)
pH: 5 (ref 5.0–8.0)

## 2017-07-13 LAB — LIPASE, BLOOD: LIPASE: 26 U/L (ref 11–51)

## 2017-07-13 LAB — PREGNANCY, URINE: PREG TEST UR: NEGATIVE

## 2017-07-13 MED ORDER — DICYCLOMINE HCL 10 MG PO CAPS
20.0000 mg | ORAL_CAPSULE | Freq: Once | ORAL | Status: AC
  Administered 2017-07-13: 20 mg via ORAL
  Filled 2017-07-13: qty 2

## 2017-07-13 MED ORDER — IBUPROFEN 400 MG PO TABS
600.0000 mg | ORAL_TABLET | Freq: Once | ORAL | Status: AC
  Administered 2017-07-13: 600 mg via ORAL
  Filled 2017-07-13: qty 2

## 2017-07-13 MED ORDER — FAMOTIDINE 20 MG PO TABS: 20.0000 mg | ORAL_TABLET | Freq: Two times a day (BID) | ORAL | 0 refills | Status: DC

## 2017-07-13 MED ORDER — ONDANSETRON 4 MG PO TBDP
4.0000 mg | ORAL_TABLET | Freq: Once | ORAL | Status: AC
  Administered 2017-07-13: 4 mg via ORAL
  Filled 2017-07-13: qty 1

## 2017-07-13 NOTE — Discharge Instructions (Signed)
Fortunately today your blood work and your ultrasound were reassuring, however we do not have a clear reason why your stomach is been hurting.  Please begin taking your antacid medication twice a day as prescribed for the next month and establish care with primary care physician within the next few weeks for reexamination.  It was a pleasure to take care of you today, and thank you for coming to our emergency department.  If you have any questions or concerns before leaving please ask the nurse to grab me and I'm more than happy to go through your aftercare instructions again.  If you were prescribed any opioid pain medication today such as Norco, Vicodin, Percocet, morphine, hydrocodone, or oxycodone please make sure you do not drive when you are taking this medication as it can alter your ability to drive safely.  If you have any concerns once you are home that you are not improving or are in fact getting worse before you can make it to your follow-up appointment, please do not hesitate to call 911 and come back for further evaluation.  Merrily BrittleNeil Carlitos Bottino, MD  Results for orders placed or performed during the hospital encounter of 07/13/17  Lipase, blood  Result Value Ref Range   Lipase 26 11 - 51 U/L  Comprehensive metabolic panel  Result Value Ref Range   Sodium 136 135 - 145 mmol/L   Potassium 3.7 3.5 - 5.1 mmol/L   Chloride 106 101 - 111 mmol/L   CO2 24 22 - 32 mmol/L   Glucose, Bld 92 65 - 99 mg/dL   BUN 12 6 - 20 mg/dL   Creatinine, Ser 4.090.76 0.44 - 1.00 mg/dL   Calcium 8.9 8.9 - 81.110.3 mg/dL   Total Protein 6.9 6.5 - 8.1 g/dL   Albumin 3.9 3.5 - 5.0 g/dL   AST 19 15 - 41 U/L   ALT 21 14 - 54 U/L   Alkaline Phosphatase 70 38 - 126 U/L   Total Bilirubin 0.4 0.3 - 1.2 mg/dL   GFR calc non Af Amer >60 >60 mL/min   GFR calc Af Amer >60 >60 mL/min   Anion gap 6 5 - 15  CBC  Result Value Ref Range   WBC 5.0 3.6 - 11.0 K/uL   RBC 4.51 3.80 - 5.20 MIL/uL   Hemoglobin 12.6 12.0 - 16.0  g/dL   HCT 91.438.5 78.235.0 - 95.647.0 %   MCV 85.4 80.0 - 100.0 fL   MCH 27.9 26.0 - 34.0 pg   MCHC 32.7 32.0 - 36.0 g/dL   RDW 21.315.6 (H) 08.611.5 - 57.814.5 %   Platelets 267 150 - 440 K/uL  Urinalysis, Complete w Microscopic  Result Value Ref Range   Color, Urine YELLOW (A) YELLOW   APPearance HAZY (A) CLEAR   Specific Gravity, Urine 1.019 1.005 - 1.030   pH 5.0 5.0 - 8.0   Glucose, UA NEGATIVE NEGATIVE mg/dL   Hgb urine dipstick NEGATIVE NEGATIVE   Bilirubin Urine NEGATIVE NEGATIVE   Ketones, ur NEGATIVE NEGATIVE mg/dL   Protein, ur NEGATIVE NEGATIVE mg/dL   Nitrite NEGATIVE NEGATIVE   Leukocytes, UA MODERATE (A) NEGATIVE   RBC / HPF 6-30 0 - 5 RBC/hpf   WBC, UA 0-5 0 - 5 WBC/hpf   Bacteria, UA NONE SEEN NONE SEEN   Squamous Epithelial / LPF 6-30 (A) NONE SEEN   Mucus PRESENT    Amorphous Crystal PRESENT   Pregnancy, urine  Result Value Ref Range   Preg Test, Ur  NEGATIVE NEGATIVE  Bilirubin, direct  Result Value Ref Range   Bilirubin, Direct <0.1 (L) 0.1 - 0.5 mg/dL   US Abdomen Limited Ruq  Result Date: 07/13/2017 CLINICAL DATA:  Right upper quadrant pain. EXAM: ULTRASOUND ABDOMEN LIMITED RIGHT UPPER QUADRANT COMPARISON:  01/11/2017 CT. FINDINGS: Gallbladder: No gallstones or wall thickening visualized. No sonographic Murphy sign noted by sonographer. Common bile duct: Diameter: Normal, 3 mm. Liver: No focal lesion identified. Within normal limits in parenchymal echogenicity. Portal vein is patent on color Doppler imaging with normal direction of blood flow towards the liver. IMPRESSION: Normal right upper quadrant ultrasound. Electronically Signed   By: Jeronimo Greaves M.D.   On: 07/13/2017 13:20

## 2017-07-13 NOTE — ED Notes (Signed)
Patient transported to Ultrasound 

## 2017-07-13 NOTE — ED Notes (Signed)
Pt unable to urinate at this time, given specimen cup for when is able to void.  

## 2017-07-13 NOTE — ED Notes (Signed)
MD at bedside to review results with patient.

## 2017-07-13 NOTE — ED Provider Notes (Signed)
[facility] Emergency Department Provider Note  ____________________________________________   First MD Initiated Contact with Patient 07/13/17 1238     (approximate)  I have reviewed the triage vital signs and the nursing notes.   HISTORY  Chief Complaint Abdominal Pain    HPI Melinda Barnett is a 22 y.o. female self presents to the emergency department with 1 week of progressive intermittent moderate severity right upper quadrant postprandial abdominal pain associated with nausea.  The pain comes and goes and is worse after eating and somewhat improved with rest.  She says that she was seen in our emergency department roughly 5 or 6 months ago and told she had "a swollen gallbladder" however she has never allowed up.  She does have somewhat of a bitter taste in her mouth often along with cough in the morning.  Past Medical History:  Diagnosis Date  . Anemia affecting pregnancy in second trimester, antepartum   . Asthma   . Headache 22 years old    Patient Active Problem List   Diagnosis Date Noted  . Enteritis   . Postpartum care following vaginal delivery 09/11/2015  . Labor and delivery indication for care or intervention 09/10/2015  . Decreased fetal movement 09/07/2015  . Irregular uterine contractions 09/01/2015  . Irregular contractions 09/01/2015  . Abdominal pain affecting pregnancy 08/25/2015  . Indication for care in labor and delivery, antepartum 08/08/2015  . Labor and delivery, indication for care 07/10/2015  . Preterm labor 06/12/2015  . Pelvic pressure in pregnancy, antepartum 06/11/2015    Past Surgical History:  Procedure Laterality Date  . NO PAST SURGERIES      Prior to Admission medications   Medication Sig Start Date End Date Taking? Authorizing Provider  albuterol (PROVENTIL HFA;VENTOLIN HFA) 108 (90 BASE) MCG/ACT inhaler Inhale 2 puffs into the lungs every 6 (six) hours as needed for wheezing or shortness of breath.     [provider]  cyclobenzaprine (FLEXERIL) 10 MG tablet Take 1 tablet (10 mg total) by mouth 3 (three) times daily as needed. Patient not taking: Reported on 01/11/2017 09/20/16   Sable Feil, PA-C  famotidine (PEPCID) 20 MG tablet Take 1 tablet (20 mg total) by mouth 2 (two) times daily. 07/13/17 07/13/18  Darel Hong, MD  measles, mumps and rubella vaccine (MMR) injection Inject 0.5 mLs into the skin once. Patient not taking: Reported on 01/11/2017 09/12/15   Rod Can, CNM  naproxen (NAPROSYN) 500 MG tablet Take 1 tablet (500 mg total) by mouth 2 (two) times daily with a meal. 01/08/17 01/08/18  Alfred Levins, [place], MD  naproxen (NAPROSYN) 500 MG tablet Take 1 tablet (500 mg total) by mouth 2 (two) times daily with a meal. 01/11/17 01/11/18  Alfred Levins, [place], MD  norethindrone (AYGESTIN) 5 MG tablet Take 1 tablet (5 mg total) by mouth daily. At the same time every day Patient not taking: Reported on 01/11/2017 09/23/15   Rod Can, CNM  ondansetron (ZOFRAN ODT) 4 MG disintegrating tablet Take 1 tablet (4 mg total) by mouth every 8 (eight) hours as needed for nausea or vomiting. 01/11/17   Rudene Re, MD  traMADol (ULTRAM) 50 MG tablet Take 1 tablet (50 mg total) by mouth every 6 (six) hours as needed for moderate pain. Patient not taking: Reported on 01/11/2017 09/20/16   Sable Feil, PA-C  varicella virus vaccine live (VARIVAX) 1350 PFU/0.5ML INJ injection Inject 0.5 mLs into the skin once. Patient not taking: Reported on 01/11/2017 09/12/15  Rod Can, CNM    Allergies Patient has no known allergies.  Family History  Problem Relation Age of Onset  . Heart disease Father   . Stroke Father     Social History Social History   Tobacco Use  . Smoking status: Former Research scientist (life sciences)  . Smokeless tobacco: Never Used  Substance Use Topics  . Alcohol use: No  . Drug use: No    Review of Systems Constitutional: No fever/chills Eyes: No visual changes. ENT: No sore  throat. Cardiovascular: Denies chest pain. Respiratory: Denies shortness of breath. Gastrointestinal: Positive for abdominal pain.  Positive for nausea, no vomiting.  No diarrhea.  No constipation. Genitourinary: Negative for dysuria. Musculoskeletal: Negative for back pain. Skin: Negative for rash. Neurological: Negative for headaches, focal weakness or numbness.   ____________________________________________   PHYSICAL EXAM:  VITAL SIGNS: ED Triage Vitals  Enc Vitals Group     BP 07/13/17 1153 124/71     Pulse Rate 07/13/17 1153 64     Resp 07/13/17 1153 16     Temp 07/13/17 1153 98.7 F (37.1 C)     Temp Source 07/13/17 1153 Oral     SpO2 07/13/17 1153 100 %     Weight 07/13/17 1154 175 lb (79.4 kg)     Height 07/13/17 1154 _0  (1.549 m)     Head Circumference --      Peak Flow --      Pain Score 07/13/17 1207 9     Pain Loc --      Pain Edu? --      Excl. in [place]? --     Constitutional: Alert and oriented x4 well-appearing nontoxic no diaphoresis speaks full clear sentences Eyes: PERRL EOMI. Head: Atraumatic. Nose: No congestion/rhinnorhea. Mouth/Throat: No trismus Neck: No stridor.   Cardiovascular: Normal rate, regular rhythm. Grossly normal heart sounds.  Good peripheral circulation. Respiratory: Normal respiratory effort.  No retractions. Lungs CTAB and moving good air Gastrointestinal: Obese abdomen soft nondistended mild upper tenderness with no rebound or guarding no peritonitis no McBurney's tenderness negative Rovsing's negative Murphy's Musculoskeletal: No lower extremity edema   Neurologic:  Normal speech and language. No gross focal neurologic deficits are appreciated. Skin:  Skin is warm, dry and intact. No rash noted. Psychiatric: Mood and affect are normal. Speech and behavior are normal.    ____________________________________________   DIFFERENTIAL includes but not limited to  Biliary colic, cholecystitis, cholangitis, gastric reflux,  appendicitis ____________________________________________   LABS (all labs ordered are listed, but only abnormal results are displayed)  Labs Reviewed  CBC - Abnormal; Notable for the following components:      Result Value   RDW 15.6 (*)    All other components within normal limits  URINALYSIS, COMPLETE (UACMP) WITH MICROSCOPIC - Abnormal; Notable for the following components:   Color, Urine YELLOW (*)    APPearance HAZY (*)    Leukocytes, UA MODERATE (*)    Squamous Epithelial / LPF 6-30 (*)    All other components within normal limits  BILIRUBIN, DIRECT - Abnormal; Notable for the following components:   Bilirubin, Direct <0.1 (*)    All other components within normal limits  LIPASE, BLOOD  COMPREHENSIVE METABOLIC PANEL  PREGNANCY, URINE    Lab work reviewed by me with no acute disease __________________________________________  EKG   ____________________________________________  RADIOLOGY  Right upper quadrant ultrasound reviewed by me with no acute disease ____________________________________________   PROCEDURES  Procedure(s) performed: no  Procedures  Critical Care performed: no  Observation: no ____________________________________________   INITIAL IMPRESSION / ASSESSMENT AND PLAN / ED COURSE  Pertinent labs & imaging results that were available during my care of the patient were reviewed by me and considered in my medical decision making (see chart for details).  The patient is overall quite well-appearing.  She does have postprandial right upper quadrant abdominal pain with some tenderness.  On chart review it appears that her gallbladder was actually normal in the past.  Regardless at this point I do believe she warrants a right upper quadrant ultrasound.  Zofran dicyclomine and ibuprofen in the meantime for pain.    ----------------------------------------- 1:46 PM on 07/13/2017 -----------------------------------------  The patient feels  symptomatically somewhat improved although not completely.  Her right upper quadrant ultrasound is negative.  I had a lengthy discussion with the patient regarding her abdominal pain and a negative ultrasound I think is reasonable to treat for gastric etiology with 1 month of she is discharged home in improved condition verbalized understanding and agreement the plan.   ____________________________________________   FINAL CLINICAL IMPRESSION(S) / ED DIAGNOSES  Final diagnoses:  Pain  Right upper quadrant abdominal pain      NEW MEDICATIONS STARTED DURING THIS VISIT:  This SmartLink is deprecated. Use AVSMEDLIST instead to display the medication list for a patient.   Note:  This document was prepared using Dragon voice recognition software and may include unintentional dictation errors.     Darel Hong, MD 07/13/17 1347

## 2017-07-13 NOTE — ED Notes (Signed)
Called in the ED waiting room with no answer.

## 2017-07-13 NOTE — ED Notes (Signed)
Pt alert and oriented X4, active, cooperative, pt in NAD. RR even and unlabored, color WNL.  Pt informed to return if any life threatening symptoms occur.  Discharge and followup instructions reviewed. Pt left with all of belongings. 

## 2017-07-13 NOTE — ED Triage Notes (Addendum)
Patient reports abd pain/pressure. Denies N/V/D. Reports last time she was here she was told her gallbladder was enlarged. Ambulatory with NAD noted

## 2017-07-17 ENCOUNTER — Emergency Department
Admission: EM | Admit: 2017-07-17 | Discharge: 2017-07-17 | Disposition: A | Discharge status: Home / Self Care | Payer: Medicaid Other | Attending: Emergency Medicine | Admitting: Emergency Medicine

## 2017-07-17 ENCOUNTER — Other Ambulatory Visit: Payer: Self-pay

## 2017-07-17 ENCOUNTER — Emergency Department: Payer: Medicaid Other

## 2017-07-17 ENCOUNTER — Encounter: Payer: Self-pay | Admitting: Emergency Medicine

## 2017-07-17 DIAGNOSIS — R1011 Right upper quadrant pain: Secondary | ICD-10-CM

## 2017-07-17 DIAGNOSIS — J45909 Unspecified asthma, uncomplicated: Secondary | ICD-10-CM | POA: Insufficient documentation

## 2017-07-17 DIAGNOSIS — Z87891 Personal history of nicotine dependence: Secondary | ICD-10-CM | POA: Insufficient documentation

## 2017-07-17 LAB — COMPREHENSIVE METABOLIC PANEL
ALK PHOS: 67 U/L (ref 38–126)
ALT: 16 U/L (ref 14–54)
AST: 18 U/L (ref 15–41)
Albumin: 4.2 g/dL (ref 3.5–5.0)
Anion gap: 8 (ref 5–15)
BILIRUBIN TOTAL: 0.7 mg/dL (ref 0.3–1.2)
BUN: 13 mg/dL (ref 6–20)
CALCIUM: 9.3 mg/dL (ref 8.9–10.3)
CO2: 23 mmol/L (ref 22–32)
CREATININE: 0.69 mg/dL (ref 0.44–1.00)
Chloride: 106 mmol/L (ref 101–111)
GFR calc Af Amer: >60 mL/min (ref >60)
Glucose, Bld: 101 mg/dL — ABNORMAL HIGH (ref 65–99)
POTASSIUM: 3.7 mmol/L (ref 3.5–5.1)
Sodium: 137 mmol/L (ref 135–145)
TOTAL PROTEIN: 7.8 g/dL (ref 6.5–8.1)

## 2017-07-17 LAB — LIPASE, BLOOD: Lipase: 31 U/L (ref 11–51)

## 2017-07-17 LAB — URINALYSIS, COMPLETE (UACMP) WITH MICROSCOPIC
Bacteria, UA: NONE SEEN
Bilirubin Urine: NEGATIVE
Glucose, UA: NEGATIVE mg/dL
Ketones, ur: NEGATIVE mg/dL
Leukocytes, UA: NEGATIVE
NITRITE: NEGATIVE
Protein, ur: NEGATIVE mg/dL
pH: 5.5 (ref 5.0–8.0)

## 2017-07-17 LAB — CBC
HCT: 39.9 % (ref 35.0–47.0)
Hemoglobin: 13 g/dL (ref 12.0–16.0)
MCH: 27.8 pg (ref 26.0–34.0)
MCHC: 32.7 g/dL (ref 32.0–36.0)
MCV: 85.2 fL (ref 80.0–100.0)
PLATELETS: 269 10*3/uL (ref 150–440)
RBC: 4.68 MIL/uL (ref 3.80–5.20)
RDW: 15.5 % — AB (ref 11.5–14.5)
WBC: 5.2 10*3/uL (ref 3.6–11.0)

## 2017-07-17 LAB — POCT PREGNANCY, URINE: PREG TEST UR: NEGATIVE

## 2017-07-17 MED ORDER — DICYCLOMINE HCL 20 MG PO TABS: 20.0000 mg | ORAL_TABLET | Freq: Three times a day (TID) | ORAL | 0 refills | Status: DC | PRN

## 2017-07-17 MED ORDER — MORPHINE SULFATE (PF) 2 MG/ML IV SOLN
2.0000 mg | Freq: Once | INTRAVENOUS | Status: AC
  Administered 2017-07-17: 2 mg via INTRAVENOUS
  Filled 2017-07-17: qty 1

## 2017-07-17 MED ORDER — ONDANSETRON HCL 4 MG/2ML IJ SOLN
4.0000 mg | Freq: Once | INTRAMUSCULAR | Status: AC
  Administered 2017-07-17: 4 mg via INTRAVENOUS
  Filled 2017-07-17: qty 2

## 2017-07-17 NOTE — ED Triage Notes (Signed)
Patient ambulatory to triage with steady gait, without difficulty or distress noted; pt reports right upper abd pain; seen Monday for same and told tests WNL but told to return here if pain persists

## 2017-07-17 NOTE — ED Provider Notes (Signed)
[facility] Emergency Department Provider Note _____   First MD Initiated Contact with Patient 07/17/17 [phone]     (approximate)  I have reviewed the triage vital signs and the nursing notes.   HISTORY  Chief Complaint Abdominal Pain    HPI Melinda Barnett is a 22 y.o. female with below list of chronic medical conditions presents to the emergency department with 10 out of 10 right upper quadrant/right flank pain with accompanied nausea and vomiting times 1 week.  Patient was seen in the emergency department on 07/13/2017 for the same at which point a ultrasound of the right upper quadrant was performed which was negative.  Patient states that pain is been persistent and is described as sharp/aching.  Patient denies any dysuria.  Patient denies any fever afebrile on presentation  Past Medical History:  Diagnosis Date  . Anemia affecting pregnancy in second trimester, antepartum   . Asthma   . Headache 22 years old    Patient Active Problem List   Diagnosis Date Noted  . Enteritis   . Postpartum care following vaginal delivery 09/11/2015  . Labor and delivery indication for care or intervention 09/10/2015  . Decreased fetal movement 09/07/2015  . Irregular uterine contractions 09/01/2015  . Irregular contractions 09/01/2015  . Abdominal pain affecting pregnancy 08/25/2015  . Indication for care in labor and delivery, antepartum 08/08/2015  . Labor and delivery, indication for care 07/10/2015  . Preterm labor 06/12/2015  . Pelvic pressure in pregnancy, antepartum 06/11/2015    Past Surgical History:  Procedure Laterality Date  . NO PAST SURGERIES      Prior to Admission medications   Medication Sig Start Date End Date Taking? Authorizing Provider  albuterol (PROVENTIL HFA;VENTOLIN HFA) 108 (90 BASE) MCG/ACT inhaler Inhale 2 puffs into the lungs every 6 (six) hours as needed for wheezing or shortness of breath.    [provider]    cyclobenzaprine (FLEXERIL) 10 MG tablet Take 1 tablet (10 mg total) by mouth 3 (three) times daily as needed. Patient not taking: Reported on 01/11/2017 09/20/16   Sable Feil, PA-C  famotidine (PEPCID) 20 MG tablet Take 1 tablet (20 mg total) by mouth 2 (two) times daily. 07/13/17 07/13/18  Darel Hong, MD  measles, mumps and rubella vaccine (MMR) injection Inject 0.5 mLs into the skin once. Patient not taking: Reported on 01/11/2017 09/12/15   Rod Can, CNM  naproxen (NAPROSYN) 500 MG tablet Take 1 tablet (500 mg total) by mouth 2 (two) times daily with a meal. 01/08/17 01/08/18  Alfred Levins, [place], MD  naproxen (NAPROSYN) 500 MG tablet Take 1 tablet (500 mg total) by mouth 2 (two) times daily with a meal. 01/11/17 01/11/18  Alfred Levins, [place], MD  norethindrone (AYGESTIN) 5 MG tablet Take 1 tablet (5 mg total) by mouth daily. At the same time every day Patient not taking: Reported on 01/11/2017 09/23/15   Rod Can, CNM  ondansetron (ZOFRAN ODT) 4 MG disintegrating tablet Take 1 tablet (4 mg total) by mouth every 8 (eight) hours as needed for nausea or vomiting. 01/11/17   Rudene Re, MD  traMADol (ULTRAM) 50 MG tablet Take 1 tablet (50 mg total) by mouth every 6 (six) hours as needed for moderate pain. Patient not taking: Reported on 01/11/2017 09/20/16   Sable Feil, PA-C  varicella virus vaccine live (VARIVAX) 1350 PFU/0.5ML INJ injection Inject 0.5 mLs into the skin once. Patient not taking: Reported on 01/11/2017 09/12/15   Rod Can, CNM  Allergies Patient has no known allergies.  Family History  Problem Relation Age of Onset  . Heart disease Father   . Stroke Father     Social History Social History   Tobacco Use  . Smoking status: Former Research scientist (life sciences)  . Smokeless tobacco: Never Used  Substance Use Topics  . Alcohol use: No  . Drug use: No    Review of Systems Constitutional: No fever/chills Eyes: No visual changes. ENT: No sore throat. Cardiovascular: Denies  chest pain. Respiratory: Denies shortness of breath. Gastrointestinal: Positive abdominal pain.  vomiting.  No diarrhea.  No constipation. Genitourinary: Negative for dysuria. Musculoskeletal: Negative for neck pain.  Negative for back pain. Integumentary: Negative for rash. Neurological: Negative for headaches, focal weakness or numbness.   ____________________________________________   PHYSICAL EXAM:  VITAL SIGNS: ED Triage Vitals  Enc Vitals Group     BP 07/17/17 0553 114/78     Pulse Rate 07/17/17 0553 79     Resp 07/17/17 0553 20     Temp 07/17/17 0553 97.7 F (36.5 C)     Temp Source 07/17/17 0553 Oral     SpO2 07/17/17 0553 100 %     Weight 07/17/17 0554 79.4 kg (175 lb)     Height 07/17/17 0554 1.549 m (5' 1" )     Head Circumference --      Peak Flow --      Pain Score 07/17/17 0553 10     Pain Loc --      Pain Edu? --      Excl. in [place]? --     Constitutional: Alert and oriented. Well appearing and in no acute distress. Eyes: Conjunctivae are normal. Head: Atraumatic. Mouth/Throat: Mucous membranes are moist.  Oropharynx non-erythematous. Neck: No stridor. Cardiovascular: Normal rate, regular rhythm. Good peripheral circulation. Grossly normal heart sounds. Respiratory: Normal respiratory effort.  No retractions. Lungs CTAB. Gastrointestinal: Soft and nontender. No distention.  Musculoskeletal: No lower extremity tenderness nor edema. No gross deformities of extremities. Neurologic:  Normal speech and language. No gross focal neurologic deficits are appreciated.  Skin:  Skin is warm, dry and intact. No rash noted. Psychiatric: Mood and affect are normal. Speech and behavior are normal.  ____________________________________________   LABS (all labs ordered are listed, but only abnormal results are displayed)  Labs Reviewed  COMPREHENSIVE METABOLIC PANEL - Abnormal; Notable for the following components:      Result Value   Glucose, Bld 101 (*)    All other  components within normal limits  CBC - Abnormal; Notable for the following components:   RDW 15.5 (*)    All other components within normal limits  URINALYSIS, COMPLETE (UACMP) WITH MICROSCOPIC - Abnormal; Notable for the following components:   Specific Gravity, Urine >1.030 (*)    Hgb urine dipstick SMALL (*)    Squamous Epithelial / LPF 0-5 (*)    All other components within normal limits  LIPASE, BLOOD  POC URINE PREG, ED  POCT PREGNANCY, URINE    RADIOLOGY I, Burke N Sharunda Salmon, personally viewed and evaluated these images (plain radiographs) as part of my medical decision making, as well as reviewing the written report by the radiologist.  Ct Renal Stone Study  Result Date: 07/17/2017 CLINICAL DATA:  22 year old female with right flank pain. EXAM: CT ABDOMEN AND PELVIS WITHOUT CONTRAST TECHNIQUE: Multidetector CT imaging of the abdomen and pelvis was performed following the standard protocol without IV contrast. COMPARISON:  Right upper quadrant ultrasound 07/13/2017 and CT of the abdomen  pelvis dated 01/11/2017 FINDINGS: Evaluation of this exam is limited in the absence of intravenous contrast. Lower chest: The visualized lung bases are clear. No intra-free air or free fluid. Hepatobiliary: No focal liver abnormality is seen. No gallstones, gallbladder wall thickening, or biliary dilatation. Pancreas: Unremarkable. No pancreatic ductal dilatation or surrounding inflammatory changes. Spleen: Choose 1 Adrenals/Urinary Tract: Adrenal glands are unremarkable. Kidneys are normal, without renal calculi, focal lesion, or hydronephrosis. Bladder is unremarkable. Stomach/Bowel: Evaluation of the bowel is limited in the absence of oral contrast. There is no bowel obstruction or active inflammation. Normal appendix. Vascular/Lymphatic: The abdominal aorta and IVC are grossly unremarkable on this noncontrast CT. No portal venous gas. There is no adenopathy. Reproductive: The uterus is anteverted and  grossly unremarkable. The ovaries are grossly unremarkable as visualized. Other: None Musculoskeletal: No acute or significant osseous findings. Stable sclerotic changes of the greater trochanter of the right femur, likely bone islands. IMPRESSION: 1. No acute intra-abdominopelvic pathology. No hydronephrosis or nephrolithiasis. 2. No bowel obstruction or active inflammation.  Normal appendix. Electronically Signed   By: Anner Crete M.D.   On: 07/17/2017 06:57      Procedures   ____________________________________________   INITIAL IMPRESSION / ASSESSMENT AND PLAN / ED COURSE  As part of my medical decision making, I reviewed the following data within the electronic MEDICAL RECORD 79 year old female presented with above-stated history and physical exam of continued right upper quadrant abdominal pain CT scan of the abdomen pelvis performed today revealed no clear etiology for the patient's pain.  I spoke with the patient at length regarding the necessity of following up with gastroenterology Dr. Marius Ditch for further outpatient evaluation ____________________________________________  FINAL CLINICAL IMPRESSION(S) / ED DIAGNOSES  Final diagnoses:  RUQ pain     MEDICATIONS GIVEN DURING THIS VISIT:  Medications  morphine 2 MG/ML injection 2 mg (2 mg Intravenous Given 07/17/17 0624)  ondansetron (ZOFRAN) injection 4 mg (4 mg Intravenous Given 07/17/17 6314)     ED Discharge Orders    None       Note:  This document was prepared using Dragon voice recognition software and may include unintentional dictation errors.    Gregor Hams, MD 07/17/17 [phone]

## 2017-07-17 NOTE — ED Notes (Signed)
Dr Brown and Butch, RN at bedside at this time. 

## 2017-07-17 NOTE — ED Notes (Signed)

## 2017-07-17 NOTE — ED Notes (Signed)
Pt returned to ED Rm 9 from CT at this time. 

## 2017-10-01 ENCOUNTER — Ambulatory Visit: Payer: Self-pay | Admitting: Obstetrics and Gynecology

## 2017-11-14 ENCOUNTER — Other Ambulatory Visit: Payer: Self-pay

## 2017-11-14 ENCOUNTER — Emergency Department
Admission: EM | Admit: 2017-11-14 | Discharge: 2017-11-15 | Disposition: A | Discharge status: Home / Self Care | Payer: Medicaid Other | Attending: Emergency Medicine | Admitting: Emergency Medicine

## 2017-11-14 ENCOUNTER — Encounter: Payer: Self-pay | Admitting: Emergency Medicine

## 2017-11-14 DIAGNOSIS — O26891 Other specified pregnancy related conditions, first trimester: Secondary | ICD-10-CM | POA: Insufficient documentation

## 2017-11-14 DIAGNOSIS — Z3A10 10 weeks gestation of pregnancy: Secondary | ICD-10-CM | POA: Diagnosis not present

## 2017-11-14 DIAGNOSIS — O210 Mild hyperemesis gravidarum: Secondary | ICD-10-CM | POA: Insufficient documentation

## 2017-11-14 DIAGNOSIS — J45909 Unspecified asthma, uncomplicated: Secondary | ICD-10-CM | POA: Insufficient documentation

## 2017-11-14 DIAGNOSIS — R109 Unspecified abdominal pain: Secondary | ICD-10-CM | POA: Insufficient documentation

## 2017-11-14 DIAGNOSIS — Z87891 Personal history of nicotine dependence: Secondary | ICD-10-CM | POA: Insufficient documentation

## 2017-11-14 DIAGNOSIS — O99511 Diseases of the respiratory system complicating pregnancy, first trimester: Secondary | ICD-10-CM | POA: Insufficient documentation

## 2017-11-14 LAB — COMPREHENSIVE METABOLIC PANEL
ALBUMIN: 3.8 g/dL (ref 3.5–5.0)
ALT: 12 U/L — ABNORMAL LOW (ref 14–54)
ANION GAP: 7 (ref 5–15)
AST: 17 U/L (ref 15–41)
Alkaline Phosphatase: 58 U/L (ref 38–126)
BUN: 6 mg/dL (ref 6–20)
CO2: 20 mmol/L — ABNORMAL LOW (ref 22–32)
Calcium: 9.3 mg/dL (ref 8.9–10.3)
Chloride: 108 mmol/L (ref 101–111)
Creatinine, Ser: 0.47 mg/dL (ref 0.44–1.00)
GFR calc Af Amer: >60 mL/min (ref >60)
Glucose, Bld: 105 mg/dL — ABNORMAL HIGH (ref 65–99)
POTASSIUM: 3.7 mmol/L (ref 3.5–5.1)
Sodium: 135 mmol/L (ref 135–145)
Total Bilirubin: 0.5 mg/dL (ref 0.3–1.2)
Total Protein: 7.4 g/dL (ref 6.5–8.1)

## 2017-11-14 LAB — CBC
HEMATOCRIT: 38 % (ref 35.0–47.0)
Hemoglobin: 13 g/dL (ref 12.0–16.0)
MCH: 30 pg (ref 26.0–34.0)
MCHC: 34.3 g/dL (ref 32.0–36.0)
MCV: 87.4 fL (ref 80.0–100.0)
Platelets: 294 10*3/uL (ref 150–440)
RBC: 4.35 MIL/uL (ref 3.80–5.20)
RDW: 14.2 % (ref 11.5–14.5)
WBC: 6.3 10*3/uL (ref 3.6–11.0)

## 2017-11-14 LAB — LIPASE, BLOOD: LIPASE: 28 U/L (ref 11–51)

## 2017-11-14 MED ORDER — ONDANSETRON 4 MG PO TBDP
4.0000 mg | ORAL_TABLET | Freq: Once | ORAL | Status: AC | PRN
  Administered 2017-11-14: 4 mg via ORAL
  Filled 2017-11-14: qty 1

## 2017-11-14 MED ORDER — ONDANSETRON HCL 4 MG/2ML IJ SOLN
4.0000 mg | Freq: Once | INTRAMUSCULAR | Status: AC
  Administered 2017-11-14: 4 mg via INTRAVENOUS
  Filled 2017-11-14: qty 2

## 2017-11-14 MED ORDER — DOXYLAMINE SUCCINATE (SLEEP) 25 MG PO TABS: 25.0000 mg | ORAL_TABLET | Freq: Every evening | ORAL | 0 refills | Status: DC | PRN

## 2017-11-14 MED ORDER — DEXTROSE 5 % AND 0.9 % NACL IV BOLUS
1000.0000 mL | Freq: Once | INTRAVENOUS | Status: AC
  Administered 2017-11-14: 1000 mL via INTRAVENOUS
  Filled 2017-11-14: qty 1000

## 2017-11-14 MED ORDER — VITAMIN B-6 25 MG PO TABS: 25.0000 mg | ORAL_TABLET | Freq: Every evening | ORAL | 0 refills | Status: DC | PRN

## 2017-11-14 NOTE — ED Provider Notes (Signed)
[facility] Emergency Department Provider Note  ___________________________________________   First MD Initiated Contact with Patient 11/14/17 2227     (approximate)  I have reviewed the triage vital signs and the nursing notes.   HISTORY  Chief Complaint Emesis During Pregnancy   HPI Melinda Barnett is a 22 y.o. female who is a G2, P1 who is approximately [redacted] weeks pregnant who is presenting to the emergency department with diffuse abdominal cramping as well as nausea and vomiting.  She says that she has had nausea and vomiting throughout her entire pregnancy but it is progressed today and she is unable to keep anything down.  She is not taking anything at home for nausea and vomiting.  Denies any vaginal bleeding or discharge.  Says that she was just treated for chlamydia in late April and her discharge ceased at that point.  Known IUP on ultrasound on April 10.  Taking gummy prenatal vitamins.  States that the pain is a cramping, diffuse 10 out of 10 pain that does not radiate through to the back.  Denies any burning with urination.  Also complaining of diffuse headache which started and worsened throughout the day with increased vomiting.  Past Medical History:  Diagnosis Date  . Anemia affecting pregnancy in second trimester, antepartum   . Asthma   . Headache 22 years old    Patient Active Problem List   Diagnosis Date Noted  . Enteritis   . Postpartum care following vaginal delivery 09/11/2015  . Labor and delivery indication for care or intervention 09/10/2015  . Decreased fetal movement 09/07/2015  . Irregular uterine contractions 09/01/2015  . Irregular contractions 09/01/2015  . Abdominal pain affecting pregnancy 08/25/2015  . Indication for care in labor and delivery, antepartum 08/08/2015  . Labor and delivery, indication for care 07/10/2015  . Preterm labor 06/12/2015  . Pelvic pressure in pregnancy, antepartum 06/11/2015    Past Surgical  History:  Procedure Laterality Date  . NO PAST SURGERIES      Prior to Admission medications   Medication Sig Start Date End Date Taking? Authorizing Provider  albuterol (PROVENTIL HFA;VENTOLIN HFA) 108 (90 BASE) MCG/ACT inhaler Inhale 2 puffs into the lungs every 6 (six) hours as needed for wheezing or shortness of breath.    [provider]  Prenat-FeFum-FePo-FA-Omega 3 (TARON-C DHA) 53.5-38-1 MG CAPS Take 1 capsule by mouth daily. 10/29/17   [provider]    Allergies Patient has no known allergies.  Family History  Problem Relation Age of Onset  . Heart disease Father   . Stroke Father     Social History Social History   Tobacco Use  . Smoking status: Former Games developer  . Smokeless tobacco: Never Used  Substance Use Topics  . Alcohol use: No  . Drug use: No    Review of Systems  Constitutional: No fever/chills Eyes: No visual changes. ENT: No sore throat. Cardiovascular: Denies chest pain. Respiratory: Denies shortness of breath. Gastrointestinal:  No diarrhea.  No constipation. Genitourinary: Negative for dysuria. Musculoskeletal: Negative for back pain. Skin: Negative for rash. Neurological: Negative for focal weakness or numbness.   ____________________________________________   PHYSICAL EXAM:  VITAL SIGNS: ED Triage Vitals  Enc Vitals Group     BP 11/14/17 2055 108/63     Pulse Rate 11/14/17 2055 71     Resp 11/14/17 2055 18     Temp 11/14/17 2055 98.2 F (36.8 C)     Temp Source 11/14/17 2055 Oral  SpO2 11/14/17 2055 98 %     Weight 11/14/17 2052 170 lb (77.1 kg)     Height 11/14/17 2052  (1.549 m)     Head Circumference --      Peak Flow --      Pain Score 11/14/17 2052 8     Pain Loc --      Pain Edu? --      Excl. in [place]? --     Constitutional: Alert and oriented. Well appearing and in no acute distress. Eyes: Conjunctivae are normal.  Head: Atraumatic. Nose: No congestion/rhinnorhea. Mouth/Throat: Mucous  membranes are moist.  Neck: No stridor.   Cardiovascular: Normal rate, regular rhythm. Grossly normal heart sounds.   Respiratory: Normal respiratory effort.  No retractions. Lungs CTAB. Gastrointestinal: Soft with minimal and diffuse tenderness to palpation. No distention.  Musculoskeletal: No lower extremity tenderness nor edema.  No joint effusions. Neurologic:  Normal speech and language. No gross focal neurologic deficits are appreciated. Skin:  Skin is warm, dry and intact. No rash noted. Psychiatric: Mood and affect are normal. Speech and behavior are normal.  ____________________________________________   LABS (all labs ordered are listed, but only abnormal results are displayed)  Labs Reviewed  COMPREHENSIVE METABOLIC PANEL - Abnormal; Notable for the following components:      Result Value   CO2 20 (*)    Glucose, Bld 105 (*)    ALT 12 (*)    All other components within normal limits  LIPASE, BLOOD  CBC  URINALYSIS, COMPLETE (UACMP) WITH MICROSCOPIC  HCG, QUANTITATIVE, PREGNANCY  BETA-HYDROXYBUTYRIC ACID  POC URINE PREG, ED   ____________________________________________  EKG   ____________________________________________  RADIOLOGY   ____________________________________________   PROCEDURES  Procedure(s) performed:   Procedures  Critical Care performed:   ____________________________________________   INITIAL IMPRESSION / ASSESSMENT AND PLAN / ED COURSE  Pertinent labs & imaging results that were available during my care of the patient were reviewed by me and considered in my medical decision making (see chart for details).  Differential diagnosis includes, but is not limited to, acute appendicitis, renal colic, testicular torsion, urinary tract infection/pyelonephritis, prostatitis,  epididymitis, diverticulitis, small bowel obstruction or ileus, colitis, abdominal aortic aneurysm, gastroenteritis, hernia, etc. As part of my medical decision  making, I reviewed the following data within the electronic MEDICAL RECORD NUMBERReviewed recent ultrasounds from this past April and Notes from prior ED visits  ----------------------------------------- 11:14 PM on 11/14/2017 -----------------------------------------  Patient at this time pending fluids and Zofran.  Signed out to Dr. Lamont Snowball for reassessment.  Normal white blood cell count.  Likely abdominal pain secondary to repeated episodes of vomiting.  Unlikely to be acute intra-abdominal pathology requiring any surgical intervention. ____________________________________________   FINAL CLINICAL IMPRESSION(S) / ED DIAGNOSES  Abdominal pain in the first trimester pregnancy.  Nausea and vomiting.    NEW MEDICATIONS STARTED DURING THIS VISIT:  New Prescriptions   No medications on file     Note:  This document was prepared using Dragon voice recognition software and may include unintentional dictation errors.     Myrna Blazer, MD 11/14/17 [phone]

## 2017-11-14 NOTE — ED Triage Notes (Signed)
Pt arrives ambulatory to triage with c/o N/V/D x 1 day. Pt is in NAD.

## 2017-11-15 LAB — HCG, QUANTITATIVE, PREGNANCY: hCG, Beta Chain, Quant, S: 256869 m[IU]/mL — ABNORMAL HIGH (ref <5)

## 2017-11-15 LAB — BETA-HYDROXYBUTYRIC ACID: Beta-Hydroxybutyric Acid: 0.16 mmol/L (ref 0.05–0.27)

## 2017-11-15 MED ORDER — METOCLOPRAMIDE HCL 5 MG/ML IJ SOLN
10.0000 mg | Freq: Once | INTRAMUSCULAR | Status: AC
  Administered 2017-11-15: 10 mg via INTRAVENOUS
  Filled 2017-11-15: qty 2

## 2017-11-15 NOTE — ED Notes (Signed)
Reviewed discharge instructions, follow-up care, and prescriptions with patient. Patient verbalized understanding of all information reviewed. Patient stable, with no distress noted at this time.    

## 2017-11-15 NOTE — ED Provider Notes (Signed)
No longer vomiting.  No e/o hyperemesis on labs.  Stable for dc.   Merrily Brittle, MD 11/15/17 0111

## 2018-02-12 ENCOUNTER — Other Ambulatory Visit: Payer: Self-pay

## 2018-02-12 ENCOUNTER — Emergency Department
Admission: EM | Admit: 2018-02-12 | Discharge: 2018-02-12 | Disposition: A | Discharge status: Home / Self Care | Payer: Medicaid Other | Attending: Emergency Medicine | Admitting: Emergency Medicine

## 2018-02-12 ENCOUNTER — Encounter: Payer: Self-pay | Admitting: *Deleted

## 2018-02-12 DIAGNOSIS — R55 Syncope and collapse: Secondary | ICD-10-CM | POA: Diagnosis not present

## 2018-02-12 DIAGNOSIS — O26812 Pregnancy related exhaustion and fatigue, second trimester: Secondary | ICD-10-CM | POA: Insufficient documentation

## 2018-02-12 DIAGNOSIS — Z3A23 23 weeks gestation of pregnancy: Secondary | ICD-10-CM | POA: Insufficient documentation

## 2018-02-12 DIAGNOSIS — J4521 Mild intermittent asthma with (acute) exacerbation: Secondary | ICD-10-CM | POA: Diagnosis not present

## 2018-02-12 DIAGNOSIS — J45901 Unspecified asthma with (acute) exacerbation: Secondary | ICD-10-CM

## 2018-02-12 DIAGNOSIS — O9952 Diseases of the respiratory system complicating childbirth: Secondary | ICD-10-CM | POA: Diagnosis present

## 2018-02-12 DIAGNOSIS — Z87891 Personal history of nicotine dependence: Secondary | ICD-10-CM | POA: Diagnosis not present

## 2018-02-12 LAB — CBC
HCT: 32.5 % — ABNORMAL LOW (ref 35.0–47.0)
HEMOGLOBIN: 11.1 g/dL — AB (ref 12.0–16.0)
MCH: 29.5 pg (ref 26.0–34.0)
MCHC: 34.2 g/dL (ref 32.0–36.0)
MCV: 86.4 fL (ref 80.0–100.0)
Platelets: 281 10*3/uL (ref 150–440)
RBC: 3.76 MIL/uL — ABNORMAL LOW (ref 3.80–5.20)
RDW: 14 % (ref 11.5–14.5)
WBC: 10.4 10*3/uL (ref 3.6–11.0)

## 2018-02-12 LAB — BASIC METABOLIC PANEL
ANION GAP: 8 (ref 5–15)
BUN: 5 mg/dL — ABNORMAL LOW (ref 6–20)
CALCIUM: 9 mg/dL (ref 8.9–10.3)
CO2: 19 mmol/L — ABNORMAL LOW (ref 22–32)
Chloride: 107 mmol/L (ref 98–111)
Creatinine, Ser: 0.51 mg/dL (ref 0.44–1.00)
GFR calc Af Amer: >60 mL/min (ref >60)
GFR calc non Af Amer: >60 mL/min (ref >60)
GLUCOSE: 98 mg/dL (ref 70–99)
Potassium: 3.4 mmol/L — ABNORMAL LOW (ref 3.5–5.1)
Sodium: 134 mmol/L — ABNORMAL LOW (ref 135–145)

## 2018-02-12 LAB — GLUCOSE, CAPILLARY: GLUCOSE-CAPILLARY: 91 mg/dL (ref 70–99)

## 2018-02-12 MED ORDER — ALBUTEROL SULFATE HFA 108 (90 BASE) MCG/ACT IN AERS: 2.0000 | INHALATION_SPRAY | Freq: Four times a day (QID) | RESPIRATORY_TRACT | 0 refills | Status: DC | PRN

## 2018-02-12 MED ORDER — PREDNISONE 20 MG PO TABS
60.0000 mg | ORAL_TABLET | Freq: Once | ORAL | Status: AC
  Administered 2018-02-12: 60 mg via ORAL
  Filled 2018-02-12: qty 3

## 2018-02-12 MED ORDER — PREDNISONE 50 MG PO TABS: 50.0000 mg | ORAL_TABLET | Freq: Every day | ORAL | 0 refills | Status: AC

## 2018-02-12 NOTE — ED Notes (Signed)
Asthma attack , took own meds ( inhaler), 20 weeks preg, anxiety /panic attack

## 2018-02-12 NOTE — ED Triage Notes (Signed)
Report per EMS: Pt had asthma attack at [employer]. Pt used albuterol inhaler prior to EMS arrival. Pt experienced cramping upon resolution of the asthma attack. Pt is [redacted] wks pregnant and experienced similar episodes during first pregnancy. Pt's cramping is mostly resolved at this time. Pt in no acute respiratory distress at this time.

## 2018-02-12 NOTE — Discharge Instructions (Signed)
Please take all of your steroids as prescribed and use your inhaler as needed for shortness of breath.  If you need to use your inhaler more than once every 4 hours please come back to the hospital because that would require additional treatment.  Return to the emergency department for any new or worsening symptoms such as worsening shortness of breath, if you pass out, if your legs began to swell, or for any other issues whatsoever.  Otherwise follow-up with your OB gynecologist in 11 days as scheduled.  It was a pleasure to take care of you today, and thank you for coming to our emergency department.  If you have any questions or concerns before leaving please ask the nurse to grab me and I'm more than happy to go through your aftercare instructions again.  If you were prescribed any opioid pain medication today such as Norco, Vicodin, Percocet, morphine, hydrocodone, or oxycodone please make sure you do not drive when you are taking this medication as it can alter your ability to drive safely.  If you have any concerns once you are home that you are not improving or are in fact getting worse before you can make it to your follow-up appointment, please do not hesitate to call [phone] and come back for further evaluation.  Merrily BrittleNeil Rmoni Keplinger, MD  Results for orders placed or performed during the hospital encounter of 02/12/18  Basic metabolic panel  Result Value Ref Range   Sodium 134 (L) 135 - 145 mmol/L   Potassium 3.4 (L) 3.5 - 5.1 mmol/L   Chloride 107 98 - 111 mmol/L   CO2 19 (L) 22 - 32 mmol/L   Glucose, Bld 98 70 - 99 mg/dL   BUN 5 (L) 6 - 20 mg/dL   Creatinine, Ser 4.090.51 0.44 - 1.00 mg/dL   Calcium 9.0 8.9 - 81.110.3 mg/dL   GFR calc non Af Amer >60 >60 mL/min   GFR calc Af Amer >60 >60 mL/min   Anion gap 8 5 - 15  CBC  Result Value Ref Range   WBC 10.4 3.6 - 11.0 K/uL   RBC 3.76 (L) 3.80 - 5.20 MIL/uL   Hemoglobin 11.1 (L) 12.0 - 16.0 g/dL   HCT 91.432.5 (L) 78.235.0 - 95.647.0 %   MCV 86.4 80.0 -  100.0 fL   MCH 29.5 26.0 - 34.0 pg   MCHC 34.2 32.0 - 36.0 g/dL   RDW 21.314.0 08.611.5 - 57.814.5 %   Platelets 281 150 - 440 K/uL  Glucose, capillary  Result Value Ref Range   Glucose-Capillary 91 70 - 99 mg/dL

## 2018-02-12 NOTE — ED Triage Notes (Signed)
Pt states she became hot, diaphoretic, and felt weak and faint at restaurant. Pt states as they were leaving, she began to have an asthma attack. Pt states after the asthma attack her whole body started cramping. Pt states she continues to have generalized cramping in extremities and across stomach. Pt in no acute respiratory distress at this time.

## 2018-02-12 NOTE — ED Provider Notes (Signed)
[facility] Emergency Department Provider Note  ____________________________________________   First MD Initiated Contact with Patient 02/12/18 2304     (approximate)  I have reviewed the triage vital signs and the nursing notes.   HISTORY  Chief Complaint Near Syncope and Asthma   HPI Melinda Barnett is a 22 y.o. female who self presents to the emergency department after suffering an asthma attack earlier this afternoon.  She has a long-standing history of asthma however has never been intubated never admitted.  She is roughly [redacted] weeks pregnant with her first pregnancy.  She used for albuterol treatments at home which did improve her shortness of breath.  She said subsequently she began to have a "panic attack" feel lightheaded nauseated and nearly passed out although did not pass out.  This concerned her and prompted the visit today.  She has had no leg swelling.  She sleeps on one pillow.  Has not gained weight.  No history of DVT or pulmonary embolism.  She denies chest pain.  She is currently asymptomatic.  No recent fevers or chills.  Her symptoms when they happen were sudden onset severe.  Described as chest "tightness" and shortness of breath.  Nonradiating.    Past Medical History:  Diagnosis Date  . Anemia affecting pregnancy in second trimester, antepartum   . Asthma   . Headache 22 years old    Patient Active Problem List   Diagnosis Date Noted  . Enteritis   . Postpartum care following vaginal delivery 09/11/2015  . Labor and delivery indication for care or intervention 09/10/2015  . Decreased fetal movement 09/07/2015  . Irregular uterine contractions 09/01/2015  . Irregular contractions 09/01/2015  . Abdominal pain affecting pregnancy 08/25/2015  . Indication for care in labor and delivery, antepartum 08/08/2015  . Labor and delivery, indication for care 07/10/2015  . Preterm labor 06/12/2015  . Pelvic pressure in pregnancy, antepartum  06/11/2015    Past Surgical History:  Procedure Laterality Date  . NO PAST SURGERIES      Prior to Admission medications   Medication Sig Start Date End Date Taking? Authorizing Provider  albuterol (PROVENTIL HFA;VENTOLIN HFA) 108 (90 Base) MCG/ACT inhaler Inhale 2 puffs into the lungs every 6 (six) hours as needed for wheezing or shortness of breath. 02/12/18   Merrily Brittle, MD  doxylamine, Sleep, (UNISOM) 25 MG tablet Take 1 tablet (25 mg total) by mouth at bedtime as needed (nausea and vomiting). 11/14/17   Schaevitz, Myra Rude, MD  predniSONE (DELTASONE) 50 MG tablet Take 1 tablet (50 mg total) by mouth daily for 4 days. 02/12/18 02/16/18  Merrily Brittle, MD  Prenat-FeFum-FePo-FA-Omega 3 (TARON-C DHA) 53.5-38-1 MG CAPS Take 1 capsule by mouth daily. 10/29/17   [provider]  vitamin B-6 (PYRIDOXINE) 25 MG tablet Take 1 tablet (25 mg total) by mouth at bedtime as needed (nausea and vomiting). 11/14/17   Schaevitz, Myra Rude, MD    Allergies Patient has no known allergies.  Family History  Problem Relation Age of Onset  . Heart disease Father   . Stroke Father     Social History Social History   Tobacco Use  . Smoking status: Former Games developer  . Smokeless tobacco: Never Used  Substance Use Topics  . Alcohol use: No  . Drug use: No    Review of Systems Constitutional: No fever/chills Eyes: No visual changes. ENT: No sore throat. Cardiovascular: Denies chest pain. Respiratory: Positive for shortness of breath. Gastrointestinal: No abdominal pain.  No nausea, no vomiting.  No diarrhea.  No constipation. Genitourinary: Negative for dysuria. Musculoskeletal: Negative for back pain. Skin: Negative for rash. Neurological: Negative for headaches, focal weakness or numbness.   ____________________________________________   PHYSICAL EXAM:  VITAL SIGNS: ED Triage Vitals  Enc Vitals Group     BP 02/12/18 2124 99/60     Pulse Rate 02/12/18 2124 93     Resp  02/12/18 2124 (!) 21     Temp 02/12/18 2124 98.3 F (36.8 C)     Temp Source 02/12/18 2124 Oral     SpO2 02/12/18 2108 98 %     Weight 02/12/18 2126 177 lb (80.3 kg)     Height 02/12/18 2126 5\' 1"  (1.549 m)     Head Circumference --      Peak Flow --      Pain Score 02/12/18 2124 8     Pain Loc --      Pain Edu? --      Excl. in GC? --     Constitutional: Alert and oriented x4 pleasant cooperative speaks full clear sentences no diaphoresis Eyes: PERRL EOMI. Head: Atraumatic. Nose: No congestion/rhinnorhea. Mouth/Throat: No trismus Neck: No stridor.  No JVD and can lie completely flat Cardiovascular: Normal rate, regular rhythm. Grossly normal heart sounds.  Good peripheral circulation. Respiratory: Normal respiratory effort.  No retractions.  Very faint expiratory wheeze although moving good air Gastrointestinal: Soft nontender Musculoskeletal: No lower extremity edema legs equal in size Neurologic:  Normal speech and language. No gross focal neurologic deficits are appreciated. Skin:  Skin is warm, dry and intact. No rash noted. Psychiatric: Mood and affect are normal. Speech and behavior are normal.    ____________________________________________   DIFFERENTIAL includes but not limited to  Asthma exacerbation, pneumonia, pneumothorax, pulmonary embolism, heart failure ____________________________________________   LABS (all labs ordered are listed, but only abnormal results are displayed)  Labs Reviewed  BASIC METABOLIC PANEL - Abnormal; Notable for the following components:      Result Value   Sodium 134 (*)    Potassium 3.4 (*)    CO2 19 (*)    BUN 5 (*)    All other components within normal limits  CBC - Abnormal; Notable for the following components:   RBC 3.76 (*)    Hemoglobin 11.1 (*)    HCT 32.5 (*)    All other components within normal limits  GLUCOSE, CAPILLARY  CBG MONITORING, ED    Lab work reviewed by me with no acute  disease __________________________________________  EKG  ED ECG REPORT I, Merrily BrittleNeil Shaleta Ruacho, the attending physician, personally viewed and interpreted this ECG.  Date: 02/13/2018 EKG Time:  Rate: 86 Rhythm: normal sinus rhythm QRS Axis: normal Intervals: normal ST/T Wave abnormalities: normal Narrative Interpretation: no evidence of acute ischemia  ____________________________________________  RADIOLOGY   ____________________________________________   PROCEDURES  Procedure(s) performed: no  Procedures  Critical Care performed: no  ____________________________________________   INITIAL IMPRESSION / ASSESSMENT AND PLAN / ED COURSE  Pertinent labs & imaging results that were available during my care of the patient were reviewed by me and considered in my medical decision making (see chart for details).   As part of my medical decision making, I reviewed the following data within the electronic MEDICAL RECORD NUMBER History obtained from family if available, nursing notes, old chart and ekg, as well as notes from prior ED visits.      ----------------------------------------- 11:34 PM on 02/12/2018 -----------------------------------------  By the time  I saw the patient her lungs were almost completely clear however she had taken 4 puffs of her inhaler previously.  She has no signs of congestive heart failure and I do not suspect pulmonary embolism at this point.  I will treat her with 4 more days of prednisone and refer her back to [facility] gynecology.  Strict return precautions have been given to the patient verbalizes understanding agreement with the plan.  ____________________________________________   FINAL CLINICAL IMPRESSION(S) / ED DIAGNOSES  Final diagnoses:  Near syncope  Mild asthma with exacerbation, unspecified whether persistent      NEW MEDICATIONS STARTED DURING THIS VISIT:  Discharge Medication List as of 02/12/2018 11:34 PM    START taking these  medications   Details  predniSONE (DELTASONE) 50 MG tablet Take 1 tablet (50 mg total) by mouth daily for 4 days., Starting Fri 02/12/2018, Until Tue 02/16/2018, Print         Note:  This document was prepared using Dragon voice recognition software and may include unintentional dictation errors.     Merrily Brittle, MD 02/13/18 2217

## 2018-02-12 NOTE — ED Notes (Addendum)
Pt states is having bilateral leg cramping. Pt states she has leg cramping post asthma attack chronically. Pt denies vaginal bleeding or discharge. Pt states she does have positive fetal movement.

## 2018-02-12 NOTE — ED Notes (Signed)
Warm blanket provided by hunter, rn.

## 2018-02-12 NOTE — ED Notes (Signed)
Pt unable to give urine sample at this time; pt has specimen cup. 

## 2018-04-10 ENCOUNTER — Other Ambulatory Visit: Payer: Self-pay

## 2018-04-10 ENCOUNTER — Encounter (HOSPITAL_COMMUNITY): Payer: Self-pay

## 2018-04-10 ENCOUNTER — Emergency Department (HOSPITAL_COMMUNITY)
Admission: EM | Admit: 2018-04-10 | Discharge: 2018-04-10 | Disposition: A | Discharge status: Home / Self Care | Payer: Medicaid Other | Attending: Emergency Medicine | Admitting: Emergency Medicine

## 2018-04-10 DIAGNOSIS — R102 Pelvic and perineal pain: Secondary | ICD-10-CM | POA: Diagnosis present

## 2018-04-10 DIAGNOSIS — Z3A31 31 weeks gestation of pregnancy: Secondary | ICD-10-CM | POA: Insufficient documentation

## 2018-04-10 DIAGNOSIS — N9089 Other specified noninflammatory disorders of vulva and perineum: Secondary | ICD-10-CM | POA: Diagnosis not present

## 2018-04-10 DIAGNOSIS — Z79899 Other long term (current) drug therapy: Secondary | ICD-10-CM | POA: Insufficient documentation

## 2018-04-10 DIAGNOSIS — J45909 Unspecified asthma, uncomplicated: Secondary | ICD-10-CM | POA: Diagnosis not present

## 2018-04-10 DIAGNOSIS — O23593 Infection of other part of genital tract in pregnancy, third trimester: Secondary | ICD-10-CM | POA: Insufficient documentation

## 2018-04-10 LAB — URINALYSIS, ROUTINE W REFLEX MICROSCOPIC
Bacteria, UA: NONE SEEN
Bilirubin Urine: NEGATIVE
GLUCOSE, UA: NEGATIVE mg/dL
HGB URINE DIPSTICK: NEGATIVE
KETONES UR: NEGATIVE mg/dL
NITRITE: NEGATIVE
PROTEIN: NEGATIVE mg/dL
Specific Gravity, Urine: 1.006 (ref 1.005–1.030)
pH: 7 (ref 5.0–8.0)

## 2018-04-10 NOTE — ED Provider Notes (Signed)
[facility] EMERGENCY DEPARTMENT Provider Note   CSN: [id number] Arrival date & time: 04/10/18  1754     History   Chief Complaint Chief Complaint  Patient presents with  . Groin Pain    HPI Melinda Barnett is a 22 y.o. female without significant PMH who presents with vulvar pain.  She used Darene Lamer for hair removal about 3 weeks ago, and she felt like the product burned her skin.  She has had pain whenever her underwear or her legs rub against the area, but her pain is well controlled otherwise.  She has not used anything to help relieve the pain.  She was recently tested for STIs at one of her OB appointments, so she does not feel the need for testing currently.  She denies any urinary symptoms or changes in vaginal discharge.  She is currently about [redacted] weeks pregnant and reports a normal pregnancy so far.    Past Medical History:  Diagnosis Date  . Anemia affecting pregnancy in second trimester, antepartum   . Asthma   . Headache 22 years old    Patient Active Problem List   Diagnosis Date Noted  . Enteritis   . Postpartum care following vaginal delivery 09/11/2015  . Labor and delivery indication for care or intervention 09/10/2015  . Decreased fetal movement 09/07/2015  . Irregular uterine contractions 09/01/2015  . Irregular contractions 09/01/2015  . Abdominal pain affecting pregnancy 08/25/2015  . Indication for care in labor and delivery, antepartum 08/08/2015  . Labor and delivery, indication for care 07/10/2015  . Preterm labor 06/12/2015  . Pelvic pressure in pregnancy, antepartum 06/11/2015    Past Surgical History:  Procedure Laterality Date  . NO PAST SURGERIES       OB History    Gravida  3   Para  0   Term  0   Preterm  0   AB  1   Living  0     SAB  1   TAB  0   Ectopic  0   Multiple  0   Live Births               Home Medications    Prior to Admission medications   Medication Sig Start Date End Date  Taking? Authorizing Provider  albuterol (PROVENTIL HFA;VENTOLIN HFA) 108 (90 Base) MCG/ACT inhaler Inhale 2 puffs into the lungs every 6 (six) hours as needed for wheezing or shortness of breath. 02/12/18   Merrily Brittle, MD  doxylamine, Sleep, (UNISOM) 25 MG tablet Take 1 tablet (25 mg total) by mouth at bedtime as needed (nausea and vomiting). 11/14/17   Myrna Blazer, MD  Prenat-FeFum-FePo-FA-Omega 3 (TARON-C DHA) 53.5-38-1 MG CAPS Take 1 capsule by mouth daily. 10/29/17   [provider]  vitamin B-6 (PYRIDOXINE) 25 MG tablet Take 1 tablet (25 mg total) by mouth at bedtime as needed (nausea and vomiting). 11/14/17   Schaevitz, Myra Rude, MD    Family History Family History  Problem Relation Age of Onset  . Heart disease Father   . Stroke Father     Social History Social History   Tobacco Use  . Smoking status: Former Games developer  . Smokeless tobacco: Never Used  Substance Use Topics  . Alcohol use: No  . Drug use: No     Allergies   Patient has no known allergies.   Review of Systems Review of Systems  Constitutional: Negative for activity change and fever.  Gastrointestinal: Negative  for abdominal pain.  Genitourinary: Positive for vaginal pain. Negative for dysuria, frequency, menstrual problem, pelvic pain, vaginal bleeding and vaginal discharge.     Physical Exam Updated Vital Signs BP 108/75   Pulse 86   Temp 98.6 F (37 C) (Oral)   Resp 16   Ht 5\' 1"  (1.549 m)   Wt 82.1 kg   LMP 07/01/2017 (Exact Date)   SpO2 98%   BMI 34.20 kg/m   Physical Exam  Constitutional: She is oriented to person, place, and time. She appears well-developed and well-nourished. No distress.  HENT:  Head: Normocephalic and atraumatic.  Eyes: EOM are normal.  Neck: Normal range of motion.  Pulmonary/Chest: Effort normal.  Abdominal: Soft.  Genitourinary: Vagina normal. There is tenderness on the right labia. There is tenderness on the left labia. No vaginal  discharge found.  Genitourinary Comments: No skin breakdown or signs of infection noted on pelvic exam.  No abnormal discharge.  Slightly erythematous bilateral labia minora with mild tenderness to palpation.  Musculoskeletal: Normal range of motion.  Neurological: She is alert and oriented to person, place, and time.  Skin: Skin is warm and dry.  Psychiatric: She has a normal mood and affect. Her behavior is normal.     ED Treatments / Results  Labs (all labs ordered are listed, but only abnormal results are displayed) Labs Reviewed  URINALYSIS, ROUTINE W REFLEX MICROSCOPIC - Abnormal; Notable for the following components:      Result Value   APPearance HAZY (*)    Leukocytes, UA LARGE (*)    All other components within normal limits    EKG None  Radiology No results found.  Procedures Procedures (including critical care time)  Medications Ordered in ED Medications - No data to display   Initial Impression / Assessment and Plan / ED Course  I have reviewed the triage vital signs and the nursing notes.  Pertinent labs & imaging results that were available during my care of the patient were reviewed by me and considered in my medical decision making (see chart for details).     Vulvar inflammation after Darene Lamer use: Patient reassured that there was no blistering or signs of infection on exam.  Patient advised to use Tylenol for pain relief, and she was told that this will heal in time.  Final Clinical Impressions(s) / ED Diagnoses   Final diagnoses:  Vulvar irritation    ED Discharge Orders    None       Lennox Solders, MD 04/10/18 1950    Blane Ohara, MD 04/11/18 [phone]

## 2018-04-10 NOTE — Discharge Instructions (Signed)
Please use Tylenol for any pain and let your doctor know if your pain gets worse or if you notice any increased swelling in the area.  The area should continue to heal with time.

## 2018-04-10 NOTE — ED Triage Notes (Signed)
Pt endorses vaginal "for a while" after using nair and "it got down where it wasn't supposed to and it started burning but it stopped but now it's back" Denies vaginal d/c, dysuria or bleeding. Admits to unprotected intercourse. VSS

## 2018-05-09 ENCOUNTER — Observation Stay
Admission: EM | Admit: 2018-05-09 | Discharge: 2018-05-09 | Disposition: A | Discharge status: Home / Self Care | Payer: Medicaid Other | Attending: Obstetrics and Gynecology | Admitting: Obstetrics and Gynecology

## 2018-05-09 DIAGNOSIS — O26893 Other specified pregnancy related conditions, third trimester: Principal | ICD-10-CM | POA: Diagnosis present

## 2018-05-09 DIAGNOSIS — Z87891 Personal history of nicotine dependence: Secondary | ICD-10-CM | POA: Insufficient documentation

## 2018-05-09 DIAGNOSIS — Z3A35 35 weeks gestation of pregnancy: Secondary | ICD-10-CM | POA: Insufficient documentation

## 2018-05-09 DIAGNOSIS — O98813 Other maternal infectious and parasitic diseases complicating pregnancy, third trimester: Secondary | ICD-10-CM | POA: Insufficient documentation

## 2018-05-09 DIAGNOSIS — N907 Vulvar cyst: Secondary | ICD-10-CM | POA: Insufficient documentation

## 2018-05-09 DIAGNOSIS — R102 Pelvic and perineal pain: Secondary | ICD-10-CM | POA: Diagnosis not present

## 2018-05-09 DIAGNOSIS — R109 Unspecified abdominal pain: Secondary | ICD-10-CM

## 2018-05-09 LAB — URINE DRUG SCREEN, QUALITATIVE (ARMC ONLY)
Amphetamines, Ur Screen: NOT DETECTED
BARBITURATES, UR SCREEN: NOT DETECTED
BENZODIAZEPINE, UR SCRN: NOT DETECTED
CANNABINOID 50 NG, UR ~~LOC~~: POSITIVE — AB
Cocaine Metabolite,Ur ~~LOC~~: NOT DETECTED
MDMA (ECSTASY) UR SCREEN: NOT DETECTED
Methadone Scn, Ur: NOT DETECTED
Opiate, Ur Screen: NOT DETECTED
Phencyclidine (PCP) Ur S: NOT DETECTED
TRICYCLIC, UR SCREEN: NOT DETECTED

## 2018-05-09 LAB — URINALYSIS, ROUTINE W REFLEX MICROSCOPIC
Bacteria, UA: NONE SEEN
Bilirubin Urine: NEGATIVE
Glucose, UA: NEGATIVE mg/dL
Hgb urine dipstick: NEGATIVE
Ketones, ur: NEGATIVE mg/dL
NITRITE: NEGATIVE
Protein, ur: NEGATIVE mg/dL
SPECIFIC GRAVITY, URINE: 1.011 (ref 1.005–1.030)
pH: 6 (ref 5.0–8.0)

## 2018-05-09 LAB — WET PREP, GENITAL
SPERM: NONE SEEN
TRICH WET PREP: NONE SEEN

## 2018-05-09 LAB — CHLAMYDIA/NGC RT PCR (ARMC ONLY)
CHLAMYDIA TR: NOT DETECTED
N gonorrhoeae: NOT DETECTED

## 2018-05-09 MED ORDER — ACETAMINOPHEN 500 MG PO TABS
1000.0000 mg | ORAL_TABLET | Freq: Once | ORAL | Status: AC
  Administered 2018-05-09: 1000 mg via ORAL
  Filled 2018-05-09: qty 2

## 2018-05-09 MED ORDER — AZITHROMYCIN 250 MG PO TABS
1000.0000 mg | ORAL_TABLET | Freq: Once | ORAL | Status: AC
  Administered 2018-05-09: 1000 mg via ORAL

## 2018-05-09 MED ORDER — TERCONAZOLE 0.4 % VA CREA: 1.0000 | TOPICAL_CREAM | Freq: Every day | VAGINAL | 0 refills | Status: DC

## 2018-05-09 MED ORDER — METRONIDAZOLE 500 MG PO TABS: 500.0000 mg | ORAL_TABLET | Freq: Two times a day (BID) | ORAL | 0 refills | Status: AC

## 2018-05-09 MED ORDER — LACTATED RINGERS IV SOLN
INTRAVENOUS | Status: DC
  Administered 2018-05-09: 03:00:00 via INTRAVENOUS

## 2018-05-09 NOTE — OB Triage Note (Signed)
Patient came in for observation for preterm labor evaluation. Patient reports irregular uterine contractions that started around 0200. Patient denies of leaking of fluid, denies vaginal bleeding and spotting. Patient reports +FM upon arrival to unit. Vital signs stable and patient afebrile. FHR baseline 130 with moderate variability with accelerations 15 x 15 and no decelerations. Significant other and mother at bedside. Will continue to monitor.

## 2018-05-09 NOTE — Discharge Summary (Signed)
Melinda Barnett is a 22 y.o. female. She is at [redacted]w[redacted]d gestation. Patient's last menstrual period was 07/01/2017 (exact date). Estimated Date of Delivery: 06/10/18  Prenatal care site: [facility]  Current pregnancy complicated by: Pt denies complications 1. Chlamydia Pos multiple times from 09/2017 until 01/2018, neg TOC 02/23/18 2. Obesity BMI 31.9 3. ASthma  Chief complaint: constant lower abdomen and vaginal pain, started about 1hr ago. Has noted increased discharge, denies LOF or VB. Notes some menstrual like cramping, but mostly the constant sharp pains are bothering her.   Context: unassigned pt, receives her care in [place]. Care everywhere reviewed.   S: very uncomfortable on arrival around 0200, breathing hard and holding lower abdomen. no CTX, no VB.no LOF,  Active fetal movement. Denies: HA, visual changes, SOB, or RUQ/epigastric pain  Maternal Medical History:   Past Medical History:  Diagnosis Date  . Anemia affecting pregnancy in second trimester, antepartum   . Asthma   . Headache 22 years old    Past Surgical History:  Procedure Laterality Date  . NO PAST SURGERIES      No Known Allergies  Prior to Admission medications   Medication Sig Start Date End Date Taking? Authorizing Provider  Prenat-FeFum-FePo-FA-Omega 3 (TARON-C DHA) 53.5-38-1 MG CAPS Take 1 capsule by mouth daily. 10/29/17  Yes [provider]  albuterol (PROVENTIL HFA;VENTOLIN HFA) 108 (90 Base) MCG/ACT inhaler Inhale 2 puffs into the lungs every 6 (six) hours as needed for wheezing or shortness of breath. 02/12/18   Merrily Brittle, MD  doxylamine, Sleep, (UNISOM) 25 MG tablet Take 1 tablet (25 mg total) by mouth at bedtime as needed (nausea and vomiting). 11/14/17   Schaevitz, Myra Rude, MD  vitamin B-6 (PYRIDOXINE) 25 MG tablet Take 1 tablet (25 mg total) by mouth at bedtime as needed (nausea and vomiting). 11/14/17   Schaevitz, Myra Rude, MD      Social History: She  reports that  she has quit smoking. She has never used smokeless tobacco. She reports that she does not drink alcohol or use drugs.  Family History: family history includes Heart disease in her father; Stroke in her father.   Review of Systems: A full review of systems was performed and negative except as noted in the HPI.     O:  BP 115/67 (BP Location: Right Arm)   Pulse 77   Temp 98.2 F (36.8 C) (Oral)   Resp 18   Ht 5\' 1"  (1.549 m)   Wt 82.1 kg   LMP 07/01/2017 (Exact Date)   BMI 34.20 kg/m  Results for orders placed or performed during the hospital encounter of 05/09/18 (from the past 48 hour(s))  Wet prep, genital   Collection Time: 05/09/18  2:29 AM  Result Value Ref Range   Yeast Wet Prep HPF POC PRESENT (A) NONE SEEN   Trich, Wet Prep NONE SEEN NONE SEEN   Clue Cells Wet Prep HPF POC PRESENT (A) NONE SEEN   WBC, Wet Prep HPF POC TOO NUMEROUS TO COUNT (A) NONE SEEN   Sperm NONE SEEN   Urinalysis, Routine w reflex microscopic   Collection Time: 05/09/18  2:29 AM  Result Value Ref Range   Color, Urine YELLOW (A) YELLOW   APPearance CLEAR (A) CLEAR   Specific Gravity, Urine 1.011 1.005 - 1.030   pH 6.0 5.0 - 8.0   Glucose, UA NEGATIVE NEGATIVE mg/dL   Hgb urine dipstick NEGATIVE NEGATIVE   Bilirubin Urine NEGATIVE NEGATIVE   Ketones, ur  NEGATIVE NEGATIVE mg/dL   Protein, ur NEGATIVE NEGATIVE mg/dL   Nitrite NEGATIVE NEGATIVE   Leukocytes, UA MODERATE (A) NEGATIVE   RBC / HPF 0-5 0 - 5 RBC/hpf   WBC, UA 0-5 0 - 5 WBC/hpf   Bacteria, UA NONE SEEN NONE SEEN   Squamous Epithelial / LPF 0-5 0 - 5   Mucus PRESENT   Urine Drug Screen, Qualitative ([facility] only)   Collection Time: 05/09/18  2:29 AM  Result Value Ref Range   Tricyclic, Ur Screen NONE DETECTED NONE DETECTED   Amphetamines, Ur Screen NONE DETECTED NONE DETECTED   MDMA (Ecstasy)Ur Screen NONE DETECTED NONE DETECTED   Cocaine Metabolite,Ur [place] NONE DETECTED NONE DETECTED   Opiate, Ur Screen NONE DETECTED NONE DETECTED    Phencyclidine (PCP) Ur S NONE DETECTED NONE DETECTED   Cannabinoid 50 Ng, Ur [place] POSITIVE (A) NONE DETECTED   Barbiturates, Ur Screen NONE DETECTED NONE DETECTED   Benzodiazepine, Ur Scrn NONE DETECTED NONE DETECTED   Methadone Scn, Ur NONE DETECTED NONE DETECTED     Constitutional: NAD, AAOx3  HE/ENT: extraocular movements grossly intact, moist mucous membranes CV: RRR PULM: nl respiratory effort, CTABL     Abd: gravid, non-tender, non-distended, soft      Ext: Non-tender, Nonedematous   Psych: mood appropriate, speech normal Initial SVE: Closed/Long/OOP Pelvic: at 0350- SSE done: mod amt malodorous white dc noted. wet prep and GC-CT sent.   Pelvic exam: exam WNL except for vaginal erythema and edematous right labia minora. approx 1.5 labial cyst with poorly defined borders and no fluctuant noted. Cervix visually closed on spec exam  Fetal  monitoring: Cat I Appropriate for GA, Reactive NST Baseline: 135 Variability: moderate Accelerations: present x >2 Decelerations absent   IV started and fluid bolus given, Tylenol given for discomfort  A/P: 22 y.o. [redacted]w[redacted]d here for antenatal surveillance for Abdominal pain 3rd trimester  Principle Diagnosis:  Ligament pain in pregnancy,    Preterm labor: not present, cervix unchanged and uterine contractions resolved after IV hydration.    Wet prep: + Clue and yeast, WBCs TNTC, exam c/w BV, will treat with Flagyl, Terazol.   Recurrent Chlamydia this pregnancy, Azithromycin 1000mg  PO x 1 prior to DC.   Labial cyst: encouraged warm compresses 3-4x daily, may use Dermoplast for comfort. To f/u with prenatal care provider.   Fetal Wellbeing: Reassuring Cat 1 tracing with Reactive NST   D/c home stable, precautions reviewed, follow-up as scheduled.    Randa Ngo, CNM 05/09/2018  4:18 AM

## 2018-09-02 ENCOUNTER — Encounter (HOSPITAL_COMMUNITY): Payer: Self-pay

## 2019-06-20 ENCOUNTER — Emergency Department: Payer: Self-pay

## 2019-06-20 ENCOUNTER — Other Ambulatory Visit: Payer: Self-pay

## 2019-06-20 ENCOUNTER — Encounter: Payer: Self-pay | Admitting: Emergency Medicine

## 2019-06-20 ENCOUNTER — Emergency Department
Admission: EM | Admit: 2019-06-20 | Discharge: 2019-06-20 | Disposition: A | Discharge status: Home / Self Care | Payer: Self-pay | Attending: Emergency Medicine | Admitting: Emergency Medicine

## 2019-06-20 DIAGNOSIS — J45909 Unspecified asthma, uncomplicated: Secondary | ICD-10-CM | POA: Insufficient documentation

## 2019-06-20 DIAGNOSIS — Y9301 Activity, walking, marching and hiking: Secondary | ICD-10-CM | POA: Insufficient documentation

## 2019-06-20 DIAGNOSIS — M25562 Pain in left knee: Secondary | ICD-10-CM | POA: Insufficient documentation

## 2019-06-20 DIAGNOSIS — S63501A Unspecified sprain of right wrist, initial encounter: Secondary | ICD-10-CM | POA: Insufficient documentation

## 2019-06-20 DIAGNOSIS — Y998 Other external cause status: Secondary | ICD-10-CM | POA: Insufficient documentation

## 2019-06-20 DIAGNOSIS — Z87891 Personal history of nicotine dependence: Secondary | ICD-10-CM | POA: Insufficient documentation

## 2019-06-20 DIAGNOSIS — W010XXA Fall on same level from slipping, tripping and stumbling without subsequent striking against object, initial encounter: Secondary | ICD-10-CM | POA: Insufficient documentation

## 2019-06-20 DIAGNOSIS — Y92018 Other place in single-family (private) house as the place of occurrence of the external cause: Secondary | ICD-10-CM | POA: Insufficient documentation

## 2019-06-20 MED ORDER — NAPROXEN 500 MG PO TABS: 500.0000 mg | ORAL_TABLET | Freq: Two times a day (BID) | ORAL | 0 refills | Status: DC

## 2019-06-20 NOTE — ED Triage Notes (Signed)
Pt reports fell last pm and now with pain to right wrist and left knee

## 2019-06-20 NOTE — ED Provider Notes (Signed)
[facility] Emergency Department Provider Note   ____________________________________________   First MD Initiated Contact with Patient 06/20/19 1233     (approximate)  I have reviewed the triage vital signs and the nursing notes.   HISTORY  Chief Complaint Fall, Wrist Pain, and Knee Pain   HPI Melinda Barnett is a 23 y.o. female presents to the ED with complaint of right wrist pain and left knee pain after she fell last p.m. tripping over her child's toy.  Patient denies any head injury or loss of consciousness.  She has continued to have wrist pain.  She has been ambulatory since she fell with less pain to her knee.  She rates her pain as a 10/10.     Past Medical History:  Diagnosis Date  . Anemia affecting pregnancy in second trimester, antepartum   . Asthma   . Headache 23 years old    Patient Active Problem List   Diagnosis Date Noted  . Abdominal pain in pregnancy, third trimester 05/09/2018  . Enteritis   . Postpartum care following vaginal delivery 09/11/2015  . Labor and delivery indication for care or intervention 09/10/2015  . Decreased fetal movement 09/07/2015  . Irregular uterine contractions 09/01/2015  . Irregular contractions 09/01/2015  . Abdominal pain affecting pregnancy 08/25/2015  . Indication for care in labor and delivery, antepartum 08/08/2015  . Labor and delivery, indication for care 07/10/2015  . Preterm labor 06/12/2015  . Pelvic pressure in pregnancy, antepartum 06/11/2015    Past Surgical History:  Procedure Laterality Date  . NO PAST SURGERIES      Prior to Admission medications   Medication Sig Start Date End Date Taking? Authorizing Provider  albuterol (PROVENTIL HFA;VENTOLIN HFA) 108 (90 Base) MCG/ACT inhaler Inhale 2 puffs into the lungs every 6 (six) hours as needed for wheezing or shortness of breath. 02/12/18   Darel Hong, MD  doxylamine, Sleep, (UNISOM) 25 MG tablet Take 1 tablet (25 mg total) by  mouth at bedtime as needed (nausea and vomiting). 11/14/17   Schaevitz, Randall An, MD  naproxen (NAPROSYN) 500 MG tablet Take 1 tablet (500 mg total) by mouth 2 (two) times daily with a meal. 06/20/19   Johnn Hai, PA-C  Prenat-FeFum-FePo-FA-Omega 3 (TARON-C DHA) 53.5-38-1 MG CAPS Take 1 capsule by mouth daily. 10/29/17   [provider]  terconazole (TERAZOL 7) 0.4 % vaginal cream Place 1 applicator vaginally at bedtime. 05/09/18   McVey, Murray Hodgkins, CNM  vitamin B-6 (PYRIDOXINE) 25 MG tablet Take 1 tablet (25 mg total) by mouth at bedtime as needed (nausea and vomiting). 11/14/17   Schaevitz, Randall An, MD    Allergies Patient has no known allergies.  Family History  Problem Relation Age of Onset  . Heart disease Father   . Stroke Father     Social History Social History   Tobacco Use  . Smoking status: Former Research scientist (life sciences)  . Smokeless tobacco: Never Used  Substance Use Topics  . Alcohol use: No  . Drug use: No    Review of Systems Constitutional: No fever/chills Eyes: No visual changes. Cardiovascular: Denies chest pain. Respiratory: Denies shortness of breath. Musculoskeletal: Positive right wrist pain.  Positive left knee pain. Skin: Negative for rash. Neurological: Negative for headaches, focal weakness or numbness. ____________________________________________   PHYSICAL EXAM:  VITAL SIGNS: ED Triage Vitals  Enc Vitals Group     BP --      Pulse Rate 06/20/19 1150 65     Resp  06/20/19 1150 20     Temp 06/20/19 1150 98.7 F (37.1 C)     Temp Source 06/20/19 1150 Oral     SpO2 06/20/19 1150 99 %     Weight 06/20/19 1123 175 lb (79.4 kg)     Height 06/20/19 1123 5\' 3"  (1.6 m)     Head Circumference --      Peak Flow --      Pain Score 06/20/19 1123 10     Pain Loc --      Pain Edu? --      Excl. in GC? --    Constitutional: Alert and oriented. Well appearing and in no acute distress. Eyes: Conjunctivae are normal.  Head: Atraumatic. Neck:  No stridor.   Cardiovascular: Normal rate, regular rhythm. Grossly normal heart sounds.  Good peripheral circulation. Respiratory: Normal respiratory effort.  No retractions. Lungs CTAB. Musculoskeletal: Examination of the left knee there is no gross deformity and no effusion present.  Patient is able to flex and extend her knee and is ambulatory without any assistance.  On examination of the right wrist there is marked tenderness to palpation with minimal soft tissue edema.  No ecchymosis or abrasions.  Patient is able to move all digits without any difficulty motor sensory function intact.  Capillary refill is less than 3 seconds.  Skin is intact. Neurologic:  Normal speech and language. No gross focal neurologic deficits are appreciated. No gait instability. Skin:  Skin is warm, dry and intact. No rash noted. Psychiatric: Mood and affect are normal. Speech and behavior are normal.  ____________________________________________   LABS (all labs ordered are listed, but only abnormal results are displayed)  Labs Reviewed - No data to display  RADIOLOGY  Official radiology report(s): DG Wrist Complete Right  Result Date: 06/20/2019 CLINICAL DATA:  Fall, pain EXAM: RIGHT WRIST - COMPLETE 3+ VIEW COMPARISON:  None. FINDINGS: No fracture or dislocation of the right wrist. The carpus is normally aligned. Joint spaces are well preserved. Mild soft tissue edema about the dorsum of the wrist. IMPRESSION: No fracture or dislocation of the right wrist. The carpus is normally aligned. Mild soft tissue edema about the dorsum of the wrist. Electronically Signed   By: 06/22/2019 M.D.   On: 06/20/2019 12:33    ____________________________________________   PROCEDURES  Procedure(s) performed (including Critical Care):  Procedures   ____________________________________________   INITIAL IMPRESSION / ASSESSMENT AND PLAN / ED COURSE  As part of my medical decision making, I reviewed the  following data within the electronic MEDICAL RECORD NUMBER Notes from prior ED visits and [place] Controlled Substance Database   23 year old female presents to the ED with complaint of right wrist pain and left knee pain after falling last evening over her child's toy.  Patient has been ambulatory since the accident but there has been increased pain with her wrist.  Patient is also right-hand dominant.  Physical exam shows moderate tenderness to light palpation and decreased range of motion.  X-rays were negative for acute bony injury.  Patient was placed in a Velcro cock-up wrist splint and started on naproxen 500 mg twice daily.  Patient is to ice and elevate her wrist as needed.  She was also given a note for work.  She is to follow-up with Dr. 22 if any continued problems.  ____________________________________________   FINAL CLINICAL IMPRESSION(S) / ED DIAGNOSES  Final diagnoses:  Right wrist sprain, initial encounter     ED Discharge Orders  Ordered    naproxen (NAPROSYN) 500 MG tablet  2 times daily with meals     06/20/19 1316           Note:  This document was prepared using Dragon voice recognition software and may include unintentional dictation errors.    Tommi RumpsSummers, Aivy Akter L, PA-C 06/20/19 1319    Arnaldo NatalMalinda, Paul F, MD 06/20/19 [phone]

## 2019-06-20 NOTE — Discharge Instructions (Signed)
Follow-up with Dr. Mack Guise if any continued problems with your wrist.  Ice and elevate as needed for pain and swelling.  Wear the wrist splint for the first 1 to 2 weeks.  After that you may use it as you need it.  Begin taking naproxen 500 mg twice daily with food.  You may also take Tylenol with this medication if additional pain medication as needed.

## 2019-08-05 ENCOUNTER — Ambulatory Visit: Payer: Self-pay | Attending: Internal Medicine

## 2019-08-05 DIAGNOSIS — Z20822 Contact with and (suspected) exposure to covid-19: Secondary | ICD-10-CM | POA: Insufficient documentation

## 2019-08-06 LAB — NOVEL CORONAVIRUS, NAA: SARS-CoV-2, NAA: NOT DETECTED

## 2019-08-22 ENCOUNTER — Ambulatory Visit: Payer: Self-pay | Attending: Internal Medicine

## 2019-08-30 ENCOUNTER — Ambulatory Visit: Payer: Medicaid Other | Attending: Internal Medicine

## 2019-08-30 DIAGNOSIS — Z20822 Contact with and (suspected) exposure to covid-19: Secondary | ICD-10-CM | POA: Insufficient documentation

## 2019-08-31 LAB — NOVEL CORONAVIRUS, NAA: SARS-CoV-2, NAA: NOT DETECTED

## 2020-04-02 ENCOUNTER — Other Ambulatory Visit: Payer: Self-pay

## 2020-04-02 ENCOUNTER — Emergency Department: Payer: Self-pay

## 2020-04-02 ENCOUNTER — Emergency Department
Admission: EM | Admit: 2020-04-02 | Discharge: 2020-04-02 | Disposition: A | Discharge status: Home / Self Care | Payer: Self-pay | Attending: Emergency Medicine | Admitting: Emergency Medicine

## 2020-04-02 ENCOUNTER — Encounter: Payer: Self-pay | Admitting: Emergency Medicine

## 2020-04-02 DIAGNOSIS — R079 Chest pain, unspecified: Secondary | ICD-10-CM | POA: Insufficient documentation

## 2020-04-02 DIAGNOSIS — R531 Weakness: Secondary | ICD-10-CM | POA: Insufficient documentation

## 2020-04-02 DIAGNOSIS — R200 Anesthesia of skin: Secondary | ICD-10-CM

## 2020-04-02 DIAGNOSIS — J45909 Unspecified asthma, uncomplicated: Secondary | ICD-10-CM | POA: Insufficient documentation

## 2020-04-02 LAB — CBC WITH DIFFERENTIAL/PLATELET
Abs Immature Granulocytes: 0.01 10*3/uL (ref 0.00–0.07)
Basophils Absolute: 0.1 10*3/uL (ref 0.0–0.1)
Basophils Relative: 1 %
Eosinophils Absolute: 0.2 10*3/uL (ref 0.0–0.5)
Eosinophils Relative: 3 %
HCT: 40.6 % (ref 36.0–46.0)
Hemoglobin: 13.8 g/dL (ref 12.0–15.0)
Immature Granulocytes: 0 %
Lymphocytes Relative: 36 %
Lymphs Abs: 2.4 10*3/uL (ref 0.7–4.0)
MCH: 32.1 pg (ref 26.0–34.0)
MCHC: 34 g/dL (ref 30.0–36.0)
MCV: 94.4 fL (ref 80.0–100.0)
Monocytes Absolute: 0.3 10*3/uL (ref 0.1–1.0)
Monocytes Relative: 4 %
Neutro Abs: 3.9 10*3/uL (ref 1.7–7.7)
Neutrophils Relative %: 56 %
Platelets: 266 10*3/uL (ref 150–400)
RBC: 4.3 MIL/uL (ref 3.87–5.11)
RDW: 12.8 % (ref 11.5–15.5)
WBC: 6.8 10*3/uL (ref 4.0–10.5)
nRBC: 0 % (ref 0.0–0.2)

## 2020-04-02 LAB — COMPREHENSIVE METABOLIC PANEL
ALT: 14 U/L (ref 0–44)
AST: 17 U/L (ref 15–41)
Albumin: 4.6 g/dL (ref 3.5–5.0)
Alkaline Phosphatase: 58 U/L (ref 38–126)
Anion gap: 7 (ref 5–15)
BUN: 11 mg/dL (ref 6–20)
CO2: 25 mmol/L (ref 22–32)
Calcium: 9.7 mg/dL (ref 8.9–10.3)
Chloride: 108 mmol/L (ref 98–111)
Creatinine, Ser: 0.71 mg/dL (ref 0.44–1.00)
GFR calc Af Amer: >60 mL/min (ref >60)
GFR calc non Af Amer: >60 mL/min (ref >60)
Glucose, Bld: 94 mg/dL (ref 70–99)
Potassium: 3.8 mmol/L (ref 3.5–5.1)
Sodium: 140 mmol/L (ref 135–145)
Total Bilirubin: 0.6 mg/dL (ref 0.3–1.2)
Total Protein: 7.8 g/dL (ref 6.5–8.1)

## 2020-04-02 LAB — APTT: aPTT: 33 seconds (ref 24–36)

## 2020-04-02 LAB — URINE DRUG SCREEN, QUALITATIVE (ARMC ONLY)
Amphetamines, Ur Screen: NOT DETECTED
Barbiturates, Ur Screen: NOT DETECTED
Benzodiazepine, Ur Scrn: NOT DETECTED
Cannabinoid 50 Ng, Ur ~~LOC~~: POSITIVE — AB
Cocaine Metabolite,Ur ~~LOC~~: NOT DETECTED
MDMA (Ecstasy)Ur Screen: NOT DETECTED
Methadone Scn, Ur: NOT DETECTED
Opiate, Ur Screen: NOT DETECTED
Phencyclidine (PCP) Ur S: NOT DETECTED
Tricyclic, Ur Screen: NOT DETECTED

## 2020-04-02 LAB — URINALYSIS, ROUTINE W REFLEX MICROSCOPIC
Bilirubin Urine: NEGATIVE
Glucose, UA: NEGATIVE mg/dL
Hgb urine dipstick: NEGATIVE
Ketones, ur: NEGATIVE mg/dL
Leukocytes,Ua: NEGATIVE
Nitrite: NEGATIVE
Protein, ur: NEGATIVE mg/dL
Specific Gravity, Urine: 1.002 — ABNORMAL LOW (ref 1.005–1.030)
pH: 8 (ref 5.0–8.0)

## 2020-04-02 LAB — ETHANOL: Alcohol, Ethyl (B): <10 mg/dL (ref <10)

## 2020-04-02 LAB — GLUCOSE, CAPILLARY: Glucose-Capillary: 93 mg/dL (ref 70–99)

## 2020-04-02 LAB — PREGNANCY, URINE: Preg Test, Ur: NEGATIVE

## 2020-04-02 LAB — PROTIME-INR
INR: 1.1 (ref 0.8–1.2)
Prothrombin Time: 13.4 seconds (ref 11.4–15.2)

## 2020-04-02 LAB — TROPONIN I (HIGH SENSITIVITY): Troponin I (High Sensitivity): <2 ng/L (ref <18)

## 2020-04-02 MED ORDER — IPRATROPIUM-ALBUTEROL 0.5-2.5 (3) MG/3ML IN SOLN
3.0000 mL | Freq: Once | RESPIRATORY_TRACT | Status: AC
  Administered 2020-04-02: 3 mL via RESPIRATORY_TRACT
  Filled 2020-04-02: qty 3

## 2020-04-02 MED ORDER — DIPHENHYDRAMINE HCL 50 MG/ML IJ SOLN
25.0000 mg | Freq: Once | INTRAMUSCULAR | Status: AC
  Administered 2020-04-02: 25 mg via INTRAVENOUS
  Filled 2020-04-02: qty 1

## 2020-04-02 MED ORDER — PROCHLORPERAZINE EDISYLATE 10 MG/2ML IJ SOLN
10.0000 mg | Freq: Once | INTRAMUSCULAR | Status: AC
  Administered 2020-04-02: 10 mg via INTRAVENOUS
  Filled 2020-04-02: qty 2

## 2020-04-02 NOTE — ED Provider Notes (Signed)
[facility] Emergency Department Provider Note   ____________________________________________   First MD Initiated Contact with Patient 04/02/20 1412     (approximate)  I have reviewed the triage vital signs and the nursing notes.   HISTORY  Chief Complaint Chest Pain    HPI Georgie Eduardo is a 24 y.o. female with past medical history of asthma who presents to the ED complaining of chest pain and left-sided weakness.  Patient states that she initially traveled to the ED due to tightness in her chest ongoing for the past 3 days along with some difficulty catching her breath.  She states symptoms felt similar to prior asthma exacerbation and she has since been using her inhaler with only partial relief.  While on the way to the hospital, she says she developed acute left-sided numbness and weakness, now having difficulty moving her left arm or left leg.  Symptoms came on around 130 this afternoon and have been unchanged since onset.  She denies any changes in her vision or speech, has not noticed any facial droop.  She denies any history of similar symptoms in the past.  She does endorse history of headaches along with a headache coinciding with her current symptoms.        Past Medical History:  Diagnosis Date   Asthma     There are no problems to display for this patient.   History reviewed. No pertinent surgical history.  Prior to Admission medications   Not on File    Allergies Patient has no known allergies.  No family history on file.  Social History Social History   Tobacco Use   Smoking status: Never Smoker   Smokeless tobacco: Never Used  Substance Use Topics   Alcohol use: Not Currently   Drug use: Not Currently    Review of Systems  Constitutional: No fever/chills Eyes: No visual changes. ENT: No sore throat. Cardiovascular: Positive for chest pain. Respiratory: Denies shortness of breath. Gastrointestinal: No abdominal  pain.  No nausea, no vomiting.  No diarrhea.  No constipation. Genitourinary: Negative for dysuria. Musculoskeletal: Negative for back pain. Skin: Negative for rash. Neurological: Positive for headache, positive for left-sided numbness and weakness.  ____________________________________________   PHYSICAL EXAM:  VITAL SIGNS: ED Triage Vitals  Enc Vitals Group     BP 04/02/20 1336 122/74     Pulse Rate 04/02/20 1336 61     Resp 04/02/20 1336 (!) 24     Temp 04/02/20 1336 97.9 F (36.6 C)     Temp Source 04/02/20 1336 Oral     SpO2 04/02/20 1336 100 %     Weight 04/02/20 1336 175 lb (79.4 kg)     Height 04/02/20 1336 5\' 3"  (1.6 m)     Head Circumference --      Peak Flow --      Pain Score 04/02/20 1345 10     Pain Loc --      Pain Edu? --      Excl. in GC? --     Constitutional: Alert and oriented. Eyes: Conjunctivae are normal. Head: Atraumatic. Nose: No congestion/rhinnorhea. Mouth/Throat: Mucous membranes are moist. Neck: Normal ROM Cardiovascular: Normal rate, regular rhythm. Grossly normal heart sounds. Respiratory: Normal respiratory effort.  No retractions. Lungs with mild end expiratory wheezing. Gastrointestinal: Soft and nontender. No distention. Genitourinary: deferred Musculoskeletal: No lower extremity tenderness nor edema. Neurologic:  Normal speech and language.  Left upper and lower extremity weakness noted with minimal pain response, pain  in sensation intact to right upper and lower extremities. Skin:  Skin is warm, dry and intact. No rash noted. Psychiatric: Mood and affect are normal. Speech and behavior are normal.  ____________________________________________   LABS (all labs ordered are listed, but only abnormal results are displayed)  Labs Reviewed  ETHANOL  PROTIME-INR  APTT  COMPREHENSIVE METABOLIC PANEL  CBC WITH DIFFERENTIAL/PLATELET  DIFFERENTIAL  URINE DRUG SCREEN, QUALITATIVE ([facility] ONLY)  URINALYSIS, ROUTINE W REFLEX MICROSCOPIC   POC URINE PREG, ED  TROPONIN I (HIGH SENSITIVITY)  TROPONIN I (HIGH SENSITIVITY)   ____________________________________________  EKG  ED ECG REPORT I, Chesley Noon, the attending physician, personally viewed and interpreted this ECG.   Date: 04/02/2020  EKG Time: 13:47  Rate: 60  Rhythm: normal sinus rhythm  Axis: Normal  Intervals:none  ST&T Change: None   PROCEDURES  Procedure(s) performed (including Critical Care):  Procedures   ____________________________________________   INITIAL IMPRESSION / ASSESSMENT AND PLAN / ED COURSE       24 year old female with past medical history presents to the ED initially for increasing chest tightness over the past couple of days but developed left-sided numbness and weakness on route to the ED.  Symptoms onset at 130 this afternoon and code stroke was called given unilateral weakness.  CT head is negative for acute process by verbal report from Dr. Fredia Sorrow of radiology.  Patient evaluated by neurology as part of code stroke and exam is felt to be functional in nature given patient is able to hold her left arm up once it is placed in upright position.  Risks of TPA outweigh benefits at this time and do not suspect LVO.  Complex migraine also considered and we will treat patient with migraine cocktail given her associated headache.  If her neurologic symptoms were not to improve following migraine cocktail, she would be appropriate for discharge home, otherwise will plan for MRI to rule out stroke.  Patient turned over to oncoming provider pending reassessment and possible MRI.      ____________________________________________   FINAL CLINICAL IMPRESSION(S) / ED DIAGNOSES  Final diagnoses:  Nonspecific chest pain  Left-sided weakness     ED Discharge Orders    None       Note:  This document was prepared using Dragon voice recognition software and may include unintentional dictation errors.   Chesley Noon,  MD 04/02/20 1556

## 2020-04-02 NOTE — Consult Note (Signed)
Neurology Consultation Reason for Consult: Left-sided weakness/numbness Referring Physician: Larinda Buttery, C  CC: Left-sided weakness/numbness  History is obtained from: Patient  HPI: Melinda Barnett is a 24 y.o. female who presents with left-sided weakness/numbness that has been coming/going for the past day.  He is not sure if it was ever completely gone, but it had markedly improved.  She again had acute onset of left-sided weakness/numbness this afternoon around 130.  Due to these symptoms, a code stroke was activated.  She denies any antecedent symptoms, including stress.  She does have a mild unilateral headache, and has had some spots in her vision today.  She does have a history of headaches which are associated with photophobia as well as "spots in her vision."  She did not notice any spots yesterday, but has had some today.  She also reports some chest pain that then "moves into my arm" that is also been coming and going since yesterday.  LKW: Unclear,?  Yesterday tpa given?: no, outside of window    ROS: A 14 point ROS was performed and is negative except as noted in the HPI.   Past Medical History:  Diagnosis Date  . Asthma     Family history: No history of migraines  Social History:  reports that she has never smoked. She has never used smokeless tobacco. She reports previous alcohol use. She reports previous drug use.   Exam: Current vital signs: BP 122/74 (BP Location: Left Arm)   Pulse 61   Temp 97.9 F (36.6 C) (Oral)   Resp (!) 24   Ht 5\' 3"  (1.6 m)   Wt 79.4 kg   LMP 03/14/2020 (Within Days)   SpO2 100%   BMI 31.00 kg/m  Vital signs in last 24 hours: Temp:  [97.9 F (36.6 C)] 97.9 F (36.6 C) (09/27 1336) Pulse Rate:  [61] 61 (09/27 1336) Resp:  [24] 24 (09/27 1336) BP: (122)/(74) 122/74 (09/27 1336) SpO2:  [100 %] 100 % (09/27 1336) Weight:  [79.4 kg] 79.4 kg (09/27 1336)   Physical Exam  Constitutional: Appears well-developed and well-nourished.   Psych: Affect appropriate to situation Eyes: No scleral injection HENT: No OP obstrucion MSK: no joint deformities.  Cardiovascular: Normal rate and regular rhythm.  Respiratory: Effort normal, non-labored breathing GI: Soft.  No distension. There is no tenderness.  Skin: WDI  Neuro: Mental Status: Patient is awake, alert, oriented to person, place, month, year, and situation. Patient is able to give a clear and coherent history. No signs of aphasia or neglect Cranial Nerves: II: Visual Fields are full. Pupils are equal, round, and reactive to light.   III,IV, VI: EOMI without ptosis or diploplia.  V: Facial sensation is symmetric to temperature VII: Facial movement is symmetric.  VIII: hearing is intact to voice X: Uvula elevates symmetrically XI: Shoulder shrug is symmetric. XII: tongue is midline without atrophy or fasciculations.  Motor: She has no effort in her left upper extremity when asked to raise it, however then she uses her right arm to raise her left arm and is able to hold up with good strength.  She has mild drift without pronation.  Likewise her left leg she is unable to raise or give any effort at all until I raise it at which point she is able to hold it well without drift, before suddenly starting to drift. Sensory: Sensation is symmetric to light touch and temperature in the arms and legs. Deep Tendon Reflexes: 2+ and symmetric in the biceps and  patellae.  Cerebellar: FNF and HKS are intact bilaterally, including on the supposedly nearly plegic side, as long as I am the one to "start her off"   I have reviewed labs in epic and the results pertinent to this consultation are: CBC-normal   Impression: 24 year old female with waxing/waning left-sided numbness/weakness since yesterday in the setting of headache.  On exam, she has very clearly functional deficits and I suspect that this is a purely functional presentation.  Given the blurry spots in her vision as  well as headache, a component of complicated migraine is difficult to exclude and therefore I would recommend treating with migraine cocktail.   Recommendations: 1) migraine cocktail 2) if symptoms resolved with migraine cocktail then no further imaging is needed. 3) if she continues to be symptomatic, then I would favor excluding possible underlying symptoms with embellishment by doing an MRI of her brain, though I think that this is relatively low yield given what I saw on exam   Ritta Slot, MD [facility] [phone]  If 7pm- 7am, please page neurology on call as listed in AMION.

## 2020-04-02 NOTE — Discharge Instructions (Addendum)
Please seek medical attention for any high fevers, chest pain, shortness of breath, change in behavior, persistent vomiting, bloody stool or any other new or concerning symptoms.  

## 2020-04-02 NOTE — ED Triage Notes (Signed)
This RN spoke with Dr. Larinda Buttery regarding patient care in relation to patient c/o L sided numbness, however per first RN pt's husband reported on arrival CP and asthma, and first RN reported patient immediately repsonsive to sternal rub. This RN spoke with EDP regarding concerns of L sided numbness and further orders. Per EDP [place], notify when patient returns from X-ray for him to come to triage to assess patient prior to further orders being given.

## 2020-04-02 NOTE — ED Notes (Signed)
Pt able to get out of bed and ambulate to toilet and return to bed without assistance

## 2020-04-02 NOTE — Code Documentation (Signed)
Pt arrives via POV with complaints of chest pain and SOB that developed into numbness and left sided weakness, code stroke activated upon arrival, after CT pt taken to room 10, Dr. Amada Jupiter at bedside, initial NIHSS 3, code stroke canceled, no tPA given due to unclear LKW, report off to Alf, vital signs and neuro checks Q2 hrs

## 2020-04-02 NOTE — ED Provider Notes (Signed)
Patient felt improved after migraine medications. Was able to ambulate. At this time given improvement do not feel patient requires any further emergent neuroimaging. Will discharge patient to follow up with neurology.   Phineas Semen, MD 04/02/20 [phone]

## 2020-04-02 NOTE — ED Triage Notes (Signed)
Pt presents to ED via POV with c/o CP x 3 days. Pt states "I thought it was my asthma so I took my inhalers". Pt states en route she began to have numbness to L side of her body, upon arrival to triage pt slumped in wheelchair, pt assisted back by this RN and Elmarie Shiley, RN in wheelchair. Pt with noted shallow respirations but is able to speak in full and complete sentences.

## 2020-04-02 NOTE — Progress Notes (Signed)
CH paged for CODE STROKE; pt. lying in bed being attended by neurologist when [facility] arrived; after interview w/neruologist Code Stroke canceled; pt. mentioned husband brought her to ED --> CH contacted husband and escorted him to rm.  Husband glad to see CH: 'I've just been praying outside, waiting.'  Per pt. and husband permission, CH offered prayer for pt.'s treatment and for her and husband to experience sense of divine presence and peace.  CH remains available as needed.

## 2020-04-02 NOTE — Progress Notes (Signed)
CODE STROKE- PHARMACY COMMUNICATION   Time CODE STROKE called/page received:@ 1426  Time response to CODE STROKE was made (in person or via phone): 1435  Time Stroke Kit retrieved from [place] (only if needed):N/A  Name of Provider/Nurse contacted:Olivia   Code stroke canceled  Past Medical History:  Diagnosis Date  . Asthma    Prior to Admission medications   Not on [place], PharmD, BCPS Clinical Pharmacist 04/02/2020 2:37 PM

## 2020-06-21 ENCOUNTER — Encounter: Payer: Self-pay | Admitting: Emergency Medicine

## 2020-07-12 ENCOUNTER — Other Ambulatory Visit: Payer: Self-pay

## 2020-07-12 DIAGNOSIS — Z20822 Contact with and (suspected) exposure to covid-19: Secondary | ICD-10-CM

## 2020-07-16 LAB — NOVEL CORONAVIRUS, NAA: SARS-CoV-2, NAA: NOT DETECTED

## 2020-07-19 ENCOUNTER — Other Ambulatory Visit: Payer: Medicaid Other

## 2020-07-19 ENCOUNTER — Other Ambulatory Visit: Payer: Self-pay

## 2020-07-19 DIAGNOSIS — Z20822 Contact with and (suspected) exposure to covid-19: Secondary | ICD-10-CM

## 2020-07-21 LAB — SARS-COV-2, NAA 2 DAY TAT

## 2020-07-21 LAB — NOVEL CORONAVIRUS, NAA: SARS-CoV-2, NAA: NOT DETECTED

## 2020-08-30 ENCOUNTER — Encounter: Payer: Self-pay | Admitting: Emergency Medicine

## 2020-08-30 ENCOUNTER — Emergency Department
Admission: EM | Admit: 2020-08-30 | Discharge: 2020-08-30 | Disposition: A | Discharge status: Home / Self Care | Payer: Medicaid Other | Attending: Emergency Medicine | Admitting: Emergency Medicine

## 2020-08-30 ENCOUNTER — Other Ambulatory Visit: Payer: Self-pay

## 2020-08-30 DIAGNOSIS — R197 Diarrhea, unspecified: Secondary | ICD-10-CM | POA: Insufficient documentation

## 2020-08-30 DIAGNOSIS — E860 Dehydration: Secondary | ICD-10-CM | POA: Insufficient documentation

## 2020-08-30 DIAGNOSIS — F12188 Cannabis abuse with other cannabis-induced disorder: Secondary | ICD-10-CM | POA: Insufficient documentation

## 2020-08-30 DIAGNOSIS — R1010 Upper abdominal pain, unspecified: Secondary | ICD-10-CM | POA: Insufficient documentation

## 2020-08-30 DIAGNOSIS — J45909 Unspecified asthma, uncomplicated: Secondary | ICD-10-CM | POA: Insufficient documentation

## 2020-08-30 DIAGNOSIS — R112 Nausea with vomiting, unspecified: Secondary | ICD-10-CM | POA: Insufficient documentation

## 2020-08-30 LAB — COMPREHENSIVE METABOLIC PANEL
ALT: 20 U/L (ref 0–44)
AST: 20 U/L (ref 15–41)
Albumin: 4.7 g/dL (ref 3.5–5.0)
Alkaline Phosphatase: 61 U/L (ref 38–126)
Anion gap: 10 (ref 5–15)
BUN: 12 mg/dL (ref 6–20)
CO2: 21 mmol/L — ABNORMAL LOW (ref 22–32)
Calcium: 9.4 mg/dL (ref 8.9–10.3)
Chloride: 104 mmol/L (ref 98–111)
Creatinine, Ser: 0.68 mg/dL (ref 0.44–1.00)
GFR, Estimated: >60 mL/min (ref >60)
Glucose, Bld: 131 mg/dL — ABNORMAL HIGH (ref 70–99)
Potassium: 3.9 mmol/L (ref 3.5–5.1)
Sodium: 135 mmol/L (ref 135–145)
Total Bilirubin: 1 mg/dL (ref 0.3–1.2)
Total Protein: 8 g/dL (ref 6.5–8.1)

## 2020-08-30 LAB — URINALYSIS, COMPLETE (UACMP) WITH MICROSCOPIC
Bacteria, UA: NONE SEEN
Bilirubin Urine: NEGATIVE
Glucose, UA: 50 mg/dL — AB
Hgb urine dipstick: NEGATIVE
Ketones, ur: 80 mg/dL — AB
Nitrite: NEGATIVE
Protein, ur: 100 mg/dL — AB
Specific Gravity, Urine: 1.033 — ABNORMAL HIGH (ref 1.005–1.030)
pH: 6 (ref 5.0–8.0)

## 2020-08-30 LAB — CBC
HCT: 40.3 % (ref 36.0–46.0)
Hemoglobin: 13.3 g/dL (ref 12.0–15.0)
MCH: 31.4 pg (ref 26.0–34.0)
MCHC: 33 g/dL (ref 30.0–36.0)
MCV: 95.3 fL (ref 80.0–100.0)
Platelets: 231 10*3/uL (ref 150–400)
RBC: 4.23 MIL/uL (ref 3.87–5.11)
RDW: 12.7 % (ref 11.5–15.5)
WBC: 9 10*3/uL (ref 4.0–10.5)
nRBC: 0 % (ref 0.0–0.2)

## 2020-08-30 LAB — LIPASE, BLOOD: Lipase: 24 U/L (ref 11–51)

## 2020-08-30 LAB — POC URINE PREG, ED: Preg Test, Ur: NEGATIVE

## 2020-08-30 MED ORDER — LACTATED RINGERS IV BOLUS
1000.0000 mL | Freq: Once | INTRAVENOUS | Status: AC
  Administered 2020-08-30: 1000 mL via INTRAVENOUS

## 2020-08-30 MED ORDER — ONDANSETRON 4 MG PO TBDP
4.0000 mg | ORAL_TABLET | Freq: Once | ORAL | Status: AC | PRN
  Administered 2020-08-30: 4 mg via ORAL
  Filled 2020-08-30: qty 1

## 2020-08-30 MED ORDER — HALOPERIDOL LACTATE 5 MG/ML IJ SOLN
2.0000 mg | Freq: Once | INTRAMUSCULAR | Status: AC
  Administered 2020-08-30: 2 mg via INTRAVENOUS
  Filled 2020-08-30: qty 1

## 2020-08-30 NOTE — ED Triage Notes (Signed)
Pt to ED from home c/o abd pain, n/v/d, and chills that started while sitting at a restaurant today around 1630.  Vomiting x10, diarrhea x2 since.  Pt with dry heaves in triage.  Denies urinary changes or SOB.

## 2020-08-30 NOTE — ED Provider Notes (Signed)
[facility] Emergency Department Provider Note ____________________________________________   Event Date/Time   First MD Initiated Contact with Patient 08/30/20 0402     (approximate)  I have reviewed the triage vital signs and the nursing notes.  HISTORY  Chief Complaint Abdominal Pain, Vomiting, and Diarrhea   HPI Melinda Barnett is a 25 y.o. femalewho presents to the ED for evaluation of N/V/D  Chart review indicates no relevant medical hx.  Patient reports a Nexplanon in place.  LMP about 1 month ago. Reports regular cannabis usage, but no other recreational drugs.  No drinking.  Patient presents to the ED due to recurrent emesis primarily.  Patient reports being seated at a local restaurant this evening for dinner, and prior to her even ordering or receiving food, she developed sudden onset and recurrent emesis of nonbloody nonbilious emesis.  She estimates about 20 episodes of emesis over the past 12 hours, as well as 2-3 episodes of watery diarrhea.  Denies hematochezia, melena, hematemesis, dysuria, vaginal discharge or bleeding, fever, syncope, chest pain.  She reports that she had upper abdominal pain for about 10-20 minutes earlier this evening while actively vomiting, but largely denies abdominal pain.  Past Medical History:  Diagnosis Date  . Anemia affecting pregnancy in second trimester, antepartum   . Asthma   . Headache 25 years old    Patient Active Problem List   Diagnosis Date Noted  . Abdominal pain in pregnancy, third trimester 05/09/2018  . Enteritis   . Postpartum care following vaginal delivery 09/11/2015  . Labor and delivery indication for care or intervention 09/10/2015  . Decreased fetal movement 09/07/2015  . Irregular uterine contractions 09/01/2015  . Irregular contractions 09/01/2015  . Abdominal pain affecting pregnancy 08/25/2015  . Indication for care in labor and delivery, antepartum 08/08/2015  . Labor and delivery,  indication for care 07/10/2015  . Preterm labor 06/12/2015  . Pelvic pressure in pregnancy, antepartum 06/11/2015    Past Surgical History:  Procedure Laterality Date  . NO PAST SURGERIES      Prior to Admission medications   Medication Sig Start Date End Date Taking? Authorizing Provider  albuterol (PROVENTIL HFA;VENTOLIN HFA) 108 (90 Base) MCG/ACT inhaler Inhale 2 puffs into the lungs every 6 (six) hours as needed for wheezing or shortness of breath. 02/12/18   Merrily Brittle, MD  albuterol (VENTOLIN HFA) 108 (90 Base) MCG/ACT inhaler Inhale 2 puffs into the lungs every 6 (six) hours as needed for wheezing or shortness of breath.    [provider]  doxylamine, Sleep, (UNISOM) 25 MG tablet Take 1 tablet (25 mg total) by mouth at bedtime as needed (nausea and vomiting). 11/14/17   Schaevitz, Myra Rude, MD  naproxen (NAPROSYN) 500 MG tablet Take 1 tablet (500 mg total) by mouth 2 (two) times daily with a meal. 06/20/19   Tommi Rumps, PA-C  Prenat-FeFum-FePo-FA-Omega 3 (TARON-C DHA) 53.5-38-1 MG CAPS Take 1 capsule by mouth daily. 10/29/17   [provider]  terconazole (TERAZOL 7) 0.4 % vaginal cream Place 1 applicator vaginally at bedtime. 05/09/18   McVey, Prudencio Pair, CNM  vitamin B-6 (PYRIDOXINE) 25 MG tablet Take 1 tablet (25 mg total) by mouth at bedtime as needed (nausea and vomiting). 11/14/17   Schaevitz, Myra Rude, MD    Allergies Patient has no known allergies.  Family History  Problem Relation Age of Onset  . Heart disease Father   . Stroke Father     Social History Social History  Tobacco Use  . Smoking status: Never Smoker  . Smokeless tobacco: Never Used  Substance Use Topics  . Alcohol use: Not Currently  . Drug use: Not Currently    Review of Systems  Constitutional: No fever/chills Eyes: No visual changes. ENT: No sore throat. Cardiovascular: Denies chest pain. Respiratory: Denies shortness of breath. Gastrointestinal:  Positive for abdominal pain, nausea, vomiting and diarrhea.   No constipation. Genitourinary: Negative for dysuria. Musculoskeletal: Negative for back pain. Skin: Negative for rash. Neurological: Negative for headaches, focal weakness or numbness.  ____________________________________________   PHYSICAL EXAM:  VITAL SIGNS: Vitals:   08/30/20 0219  BP: 111/77  Pulse: (!) 51  Resp: 18  Temp: 98.2 F (36.8 C)  SpO2: 98%    Constitutional: Alert and oriented. Well appearing and in no acute distress. Eyes: Conjunctivae are normal. PERRL. EOMI. Head: Atraumatic. Nose: No congestion/rhinnorhea. Mouth/Throat: Mucous membranes are dry.  Oropharynx non-erythematous. Neck: No stridor. No cervical spine tenderness to palpation. Cardiovascular: Normal rate, regular rhythm. Grossly normal heart sounds.  Good peripheral circulation. Respiratory: Normal respiratory effort.  No retractions. Lungs CTAB. Gastrointestinal: Soft , nondistended. No CVA tenderness. Minimal epigastric tenderness to palpation without peritoneal features.  Abdomen is otherwise benign with deep palpation. Musculoskeletal: No lower extremity tenderness nor edema.  No joint effusions. No signs of acute trauma. Neurologic:  Normal speech and language. No gross focal neurologic deficits are appreciated. No gait instability noted. Skin:  Skin is warm, dry and intact. No rash noted. Psychiatric: Mood and affect are normal. Speech and behavior are normal. ____________________________________________   LABS (all labs ordered are listed, but only abnormal results are displayed)  Labs Reviewed  COMPREHENSIVE METABOLIC PANEL - Abnormal; Notable for the following components:      Result Value   CO2 21 (*)    Glucose, Bld 131 (*)    All other components within normal limits  URINALYSIS, COMPLETE (UACMP) WITH MICROSCOPIC - Abnormal; Notable for the following components:   Color, Urine YELLOW (*)    APPearance CLOUDY (*)     Specific Gravity, Urine 1.033 (*)    Glucose, UA 50 (*)    Ketones, ur 80 (*)    Protein, ur 100 (*)    Leukocytes,Ua SMALL (*)    All other components within normal limits  URINE CULTURE  LIPASE, BLOOD  CBC  POC URINE PREG, ED   ____________________________________________  12 Lead EKG  Sinus rhythm with sinus arrhythmia, rate of 54 bpm.  Normal axis and intervals.  No evidence of acute ischemia. ____________________________________________   PROCEDURES and INTERVENTIONS  Procedure(s) performed (including Critical Care):  .1-3 Lead EKG Interpretation Performed by: Delton Prairie, MD Authorized by: Delton Prairie, MD     Interpretation: normal     ECG rate:  58   ECG rate assessment: bradycardic     Rhythm: sinus bradycardia     Ectopy: none     Conduction: normal      Medications  ondansetron (ZOFRAN-ODT) disintegrating tablet 4 mg (4 mg Oral Given 08/30/20 0233)  lactated ringers bolus 1,000 mL (1,000 mLs Intravenous New Bag/Given 08/30/20 0426)  haloperidol lactate (HALDOL) injection 2 mg (2 mg Intravenous Given 08/30/20 0429)    ____________________________________________   MDM / ED COURSE   Otherwise healthy 25 year old woman presents to the ED with recurrent emesis, likely due to cannabis hyperemesis syndrome, and amenable to outpatient management.  Normal vitals on room air.  Exam with some stigmata of dehydration with dry mucous membranes, she otherwise looks  well.  Abdominal exam is benign without peritoneal features.  Blood work with slight decrement in her bicarb suggestive of dehydration, otherwise unremarkable.  Patient has resolution of symptoms after administration of Haldol.  Resuscitated with 1 L of LR, and p.o. fluids were well-tolerated.  Her urinalysis appears concentrated and does have a small amount leukocytes, but considering her lack of symptoms I think be reasonable to not treat.  We will send for urine culture to better evaluate for this.  I educated  patient the importance of cessation of cannabis and we discussed return precautions for the ED.  Patient medically stable for outpatient management.   Clinical Course as of 08/30/20 0519  Thu Aug 30, 2020  0501 Reassessed.  Patient reports improving symptoms. [DS]  6503 Urinalysis noted.  Patient had no dysuria or symptoms of UTI.  We will send for a culture and abstain from antibiotics at this point. 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   Delton Prairie, MD 08/30/20 [phone]

## 2020-08-31 LAB — URINE CULTURE

## 2020-10-16 ENCOUNTER — Other Ambulatory Visit: Payer: Self-pay

## 2020-10-16 ENCOUNTER — Emergency Department
Admission: EM | Admit: 2020-10-16 | Discharge: 2020-10-17 | Disposition: A | Discharge status: Home / Self Care | Payer: Self-pay | Attending: Emergency Medicine | Admitting: Emergency Medicine

## 2020-10-16 ENCOUNTER — Emergency Department: Payer: Self-pay

## 2020-10-16 DIAGNOSIS — R0789 Other chest pain: Secondary | ICD-10-CM | POA: Insufficient documentation

## 2020-10-16 DIAGNOSIS — J45909 Unspecified asthma, uncomplicated: Secondary | ICD-10-CM | POA: Insufficient documentation

## 2020-10-16 DIAGNOSIS — G44209 Tension-type headache, unspecified, not intractable: Secondary | ICD-10-CM | POA: Insufficient documentation

## 2020-10-16 LAB — CBC
HCT: 39.4 % (ref 36.0–46.0)
Hemoglobin: 13.1 g/dL (ref 12.0–15.0)
MCH: 31.2 pg (ref 26.0–34.0)
MCHC: 33.2 g/dL (ref 30.0–36.0)
MCV: 93.8 fL (ref 80.0–100.0)
Platelets: 249 10*3/uL (ref 150–400)
RBC: 4.2 MIL/uL (ref 3.87–5.11)
RDW: 12.7 % (ref 11.5–15.5)
WBC: 8.3 10*3/uL (ref 4.0–10.5)
nRBC: 0 % (ref 0.0–0.2)

## 2020-10-16 MED ORDER — KETOROLAC TROMETHAMINE 60 MG/2ML IM SOLN
60.0000 mg | Freq: Once | INTRAMUSCULAR | Status: DC
  Filled 2020-10-16: qty 2

## 2020-10-16 NOTE — ED Provider Notes (Signed)
[facility] Emergency Department Provider Note  ____________________________________________   Event Date/Time   First MD Initiated Contact with Patient 10/16/20 2333     (approximate)  I have reviewed the triage vital signs and the nursing notes.   HISTORY  Chief Complaint Headache and Chest Pain    HPI Melinda Barnett is a 25 y.o. female here with multiple complaints.  The patient states that Melinda Barnett has been under multiple recent stressors.  Earlier tonight, Melinda Barnett began to develop a gradual onset but progressively worsening aching, throbbing, headache along Melinda Barnett bitemporal area.  It radiates around towards the back of Melinda Barnett head.  Melinda Barnett states that Melinda Barnett began to feel anxious about Melinda Barnett headache and then developed a dull, left upper chest pain that has been constant.  Melinda Barnett has had no associated shortness of breath.  No diaphoresis.  Melinda Barnett has a history of chronic, near daily headaches although Melinda Barnett current headache is slightly worse.  Melinda Barnett does admit to recent stressors.  Melinda Barnett also got Melinda Barnett Covid booster vaccine yesterday.  No fevers.  No photophobia.  No focal numbness or weakness.  No specific alleviating or aggravating factors.        Past Medical History:  Diagnosis Date  . Anemia affecting pregnancy in second trimester, antepartum   . Asthma   . Headache 25 years old    Patient Active Problem List   Diagnosis Date Noted  . Abdominal pain in pregnancy, third trimester 05/09/2018  . Enteritis   . Postpartum care following vaginal delivery 09/11/2015  . Labor and delivery indication for care or intervention 09/10/2015  . Decreased fetal movement 09/07/2015  . Irregular uterine contractions 09/01/2015  . Irregular contractions 09/01/2015  . Abdominal pain affecting pregnancy 08/25/2015  . Indication for care in labor and delivery, antepartum 08/08/2015  . Labor and delivery, indication for care 07/10/2015  . Preterm labor 06/12/2015  . Pelvic pressure in pregnancy,  antepartum 06/11/2015    Past Surgical History:  Procedure Laterality Date  . NO PAST SURGERIES      Prior to Admission medications   Medication Sig Start Date End Date Taking? Authorizing Provider  albuterol (PROVENTIL HFA;VENTOLIN HFA) 108 (90 Base) MCG/ACT inhaler Inhale 2 puffs into the lungs every 6 (six) hours as needed for wheezing or shortness of breath. 02/12/18   Merrily Brittle, MD  albuterol (VENTOLIN HFA) 108 (90 Base) MCG/ACT inhaler Inhale 2 puffs into the lungs every 6 (six) hours as needed for wheezing or shortness of breath.    [provider]  doxylamine, Sleep, (UNISOM) 25 MG tablet Take 1 tablet (25 mg total) by mouth at bedtime as needed (nausea and vomiting). 11/14/17   Schaevitz, Myra Rude, MD  naproxen (NAPROSYN) 500 MG tablet Take 1 tablet (500 mg total) by mouth 2 (two) times daily with a meal. 06/20/19   Tommi Rumps, PA-C  Prenat-FeFum-FePo-FA-Omega 3 (TARON-C DHA) 53.5-38-1 MG CAPS Take 1 capsule by mouth daily. 10/29/17   [provider]  terconazole (TERAZOL 7) 0.4 % vaginal cream Place 1 applicator vaginally at bedtime. 05/09/18   McVey, Prudencio Pair, CNM  vitamin B-6 (PYRIDOXINE) 25 MG tablet Take 1 tablet (25 mg total) by mouth at bedtime as needed (nausea and vomiting). 11/14/17   Schaevitz, Myra Rude, MD    Allergies Patient has no known allergies.  Family History  Problem Relation Age of Onset  . Heart disease Father   . Stroke Father     Social History Social History  Tobacco Use  . Smoking status: Never Smoker  . Smokeless tobacco: Never Used  Substance Use Topics  . Alcohol use: Not Currently  . Drug use: Not Currently    Review of Systems  Review of Systems  Constitutional: Positive for fatigue. Negative for fever.  HENT: Negative for congestion and sore throat.   Eyes: Negative for visual disturbance.  Respiratory: Negative for cough and shortness of breath.   Cardiovascular: Positive for chest pain.   Gastrointestinal: Negative for abdominal pain, diarrhea, nausea and vomiting.  Genitourinary: Negative for flank pain.  Musculoskeletal: Negative for back pain and neck pain.  Skin: Negative for rash and wound.  Neurological: Positive for headaches. Negative for weakness.  All other systems reviewed and are negative.    ____________________________________________  PHYSICAL EXAM:      VITAL SIGNS: ED Triage Vitals  Enc Vitals Group     BP 10/16/20 2325 106/61     Pulse Rate 10/16/20 2325 69     Resp 10/16/20 2325 19     Temp 10/16/20 2325 98.3 F (36.8 C)     Temp Source 10/16/20 2325 Oral     SpO2 10/16/20 2325 99 %     Weight 10/16/20 2327 175 lb (79.4 kg)     Height 10/16/20 2327 5\' 2"  (1.575 m)     Head Circumference --      Peak Flow --      Pain Score 10/16/20 2325 8     Pain Loc --      Pain Edu? --      Excl. in GC? --      Physical Exam Vitals and nursing note reviewed.  Constitutional:      General: Melinda Barnett is not in acute distress.    Appearance: Melinda Barnett is well-developed.  HENT:     Head: Normocephalic and atraumatic.  Eyes:     Conjunctiva/sclera: Conjunctivae normal.  Cardiovascular:     Rate and Rhythm: Normal rate and regular rhythm.     Heart sounds: Normal heart sounds. No murmur heard. No friction rub.  Pulmonary:     Effort: Pulmonary effort is normal. No respiratory distress.     Breath sounds: Normal breath sounds. No wheezing or rales.  Abdominal:     General: There is no distension.     Palpations: Abdomen is soft.     Tenderness: There is no abdominal tenderness.  Musculoskeletal:     Cervical back: Neck supple.  Skin:    General: Skin is warm.     Capillary Refill: Capillary refill takes less than 2 seconds.  Neurological:     Mental Status: Melinda Barnett is alert and oriented to person, place, and time.     GCS: GCS eye subscore is 4. GCS verbal subscore is 5. GCS motor subscore is 6.     Cranial Nerves: Cranial nerves are intact.     Sensory:  Sensation is intact.     Motor: Motor function is intact. No abnormal muscle tone.     Coordination: Coordination is intact.       ____________________________________________   LABS (all labs ordered are listed, but only abnormal results are displayed)  Labs Reviewed  BASIC METABOLIC PANEL - Abnormal; Notable for the following components:      Result Value   CO2 21 (*)    Glucose, Bld 115 (*)    All other components within normal limits  CBC  POC URINE PREG, ED  TROPONIN I (HIGH SENSITIVITY)  TROPONIN I (HIGH  SENSITIVITY)    ____________________________________________  EKG: Normal sinus rhythm, jugular rate 83.  PR 176, QRS 86, QTc 448.  No acute ST elevation or depression but acute evidence of acute ischemia or infarct. ________________________________________  RADIOLOGY All imaging, including plain films, CT scans, and ultrasounds, independently reviewed by me, and interpretations confirmed via formal radiology reads.  ED MD interpretation:   Chest x-ray: Clear  Official radiology report(s): DG Chest 2 View  Result Date: 10/17/2020 CLINICAL DATA:  Headache and chest pain. EXAM: CHEST - 2 VIEW COMPARISON:  April 02, 2020 FINDINGS: The heart size and mediastinal contours are within normal limits. Both lungs are clear. The visualized skeletal structures are unremarkable. IMPRESSION: No active cardiopulmonary disease. Electronically Signed   By: Aram Candela M.D.   On: 10/17/2020 00:25    ____________________________________________  PROCEDURES   Procedure(s) performed (including Critical Care):  Procedures  ____________________________________________  INITIAL IMPRESSION / MDM / ASSESSMENT AND PLAN / ED COURSE  As part of my medical decision making, I reviewed the following data within the electronic MEDICAL RECORD NUMBER Nursing notes reviewed and incorporated, Old chart reviewed, Notes from prior ED visits, and [place] Controlled Substance Database        *Melinda Barnett was evaluated in Emergency Department on 10/17/2020 for the symptoms described in the history of present illness. Melinda Barnett was evaluated in the context of the global COVID-19 pandemic, which necessitated consideration that the patient might be at risk for infection with the SARS-CoV-2 virus that causes COVID-19. Institutional protocols and algorithms that pertain to the evaluation of patients at risk for COVID-19 are in a state of rapid change based on information released by regulatory bodies including the CDC and federal and state organizations. These policies and algorithms were followed during the patient's care in the ED.  Some ED evaluations and interventions may be delayed as a result of limited staffing during the pandemic.*     Medical Decision Making: 25 year old female here with mild headache and chest pain.  Guarding Melinda Barnett headache, suspect this is multifactorial secondary to likely underlying tension type headache in the setting of recent stressors as well as recent Covid booster dose.  Melinda Barnett is afebrile, well-appearing, with no leukocytosis, photophobia, or signs of meningitis or encephalitis.  Melinda Barnett has no focal neurological deficits.  Melinda Barnett is mildly hypertensive here but this appears to be baseline and I do not suspect hypertensive urgency.  Melinda Barnett is alert and oriented.  Lab work is otherwise reassuring.  Regarding Melinda Barnett chest pain, this began after headache and may be stress related.  Melinda Barnett has a nonischemic EKG with negative troponin despite constant symptoms for greater than 6 hours, do not suspect ACS.  Melinda Barnett is PERC negative no signs of DVT or PE.  Chest x-ray is clear.  Will discharge with supportive care for likely tension type and post Covid vaccination headache, reassurance, and outpatient follow-up as needed  ____________________________________________  FINAL CLINICAL IMPRESSION(S) / ED DIAGNOSES  Final diagnoses:  Tension headache  Atypical chest pain     MEDICATIONS GIVEN  DURING THIS VISIT:  Medications  ketorolac (TORADOL) 30 MG/ML injection 15 mg (15 mg Intravenous Given 10/17/20 0009)  sodium chloride 0.9 % bolus 1,000 mL (1,000 mLs Intravenous New Bag/Given 10/17/20 0010)     ED Discharge Orders    None       Note:  This document was prepared using Dragon voice recognition software and may include unintentional dictation errors.   Shaune Pollack, MD 10/17/20 [phone]

## 2020-10-16 NOTE — ED Triage Notes (Signed)
Pt states that she has been having a HA and chest pain for three hours. Pt is vaccinated for COVID. Pt took her rescue inhaler at home.

## 2020-10-17 LAB — BASIC METABOLIC PANEL
Anion gap: 10 (ref 5–15)
BUN: 9 mg/dL (ref 6–20)
CO2: 21 mmol/L — ABNORMAL LOW (ref 22–32)
Calcium: 9.3 mg/dL (ref 8.9–10.3)
Chloride: 107 mmol/L (ref 98–111)
Creatinine, Ser: 0.72 mg/dL (ref 0.44–1.00)
GFR, Estimated: >60 mL/min (ref >60)
Glucose, Bld: 115 mg/dL — ABNORMAL HIGH (ref 70–99)
Potassium: 3.7 mmol/L (ref 3.5–5.1)
Sodium: 138 mmol/L (ref 135–145)

## 2020-10-17 LAB — POC URINE PREG, ED: Preg Test, Ur: NEGATIVE

## 2020-10-17 LAB — TROPONIN I (HIGH SENSITIVITY): Troponin I (High Sensitivity): 3 ng/L (ref <18)

## 2020-10-17 MED ORDER — SODIUM CHLORIDE 0.9 % IV BOLUS
1000.0000 mL | Freq: Once | INTRAVENOUS | Status: AC
  Administered 2020-10-17: 1000 mL via INTRAVENOUS

## 2020-10-17 MED ORDER — BUTALBITAL-APAP-CAFFEINE 50-325-40 MG PO TABS: 1.0000 | ORAL_TABLET | Freq: Four times a day (QID) | ORAL | 0 refills | Status: AC | PRN

## 2020-10-17 MED ORDER — KETOROLAC TROMETHAMINE 30 MG/ML IJ SOLN
15.0000 mg | Freq: Once | INTRAMUSCULAR | Status: AC
  Administered 2020-10-17: 15 mg via INTRAVENOUS
  Filled 2020-10-17: qty 1

## 2020-12-18 ENCOUNTER — Emergency Department: Payer: 59

## 2020-12-18 ENCOUNTER — Other Ambulatory Visit: Payer: Self-pay

## 2020-12-18 ENCOUNTER — Emergency Department
Admission: EM | Admit: 2020-12-18 | Discharge: 2020-12-18 | Disposition: A | Discharge status: Home / Self Care | Payer: 59 | Attending: Emergency Medicine | Admitting: Emergency Medicine

## 2020-12-18 DIAGNOSIS — F419 Anxiety disorder, unspecified: Secondary | ICD-10-CM

## 2020-12-18 DIAGNOSIS — R0602 Shortness of breath: Secondary | ICD-10-CM | POA: Insufficient documentation

## 2020-12-18 DIAGNOSIS — R079 Chest pain, unspecified: Secondary | ICD-10-CM | POA: Diagnosis not present

## 2020-12-18 DIAGNOSIS — M25562 Pain in left knee: Secondary | ICD-10-CM | POA: Diagnosis present

## 2020-12-18 DIAGNOSIS — S83002A Unspecified subluxation of left patella, initial encounter: Secondary | ICD-10-CM | POA: Diagnosis not present

## 2020-12-18 DIAGNOSIS — J45909 Unspecified asthma, uncomplicated: Secondary | ICD-10-CM | POA: Diagnosis not present

## 2020-12-18 DIAGNOSIS — R0789 Other chest pain: Secondary | ICD-10-CM | POA: Diagnosis not present

## 2020-12-18 DIAGNOSIS — M2212 Recurrent subluxation of patella, left knee: Secondary | ICD-10-CM | POA: Diagnosis not present

## 2020-12-18 LAB — BASIC METABOLIC PANEL
Anion gap: 5 (ref 5–15)
BUN: 12 mg/dL (ref 6–20)
CO2: 24 mmol/L (ref 22–32)
Calcium: 9.2 mg/dL (ref 8.9–10.3)
Chloride: 107 mmol/L (ref 98–111)
Creatinine, Ser: 0.75 mg/dL (ref 0.44–1.00)
GFR, Estimated: >60 mL/min (ref >60)
Glucose, Bld: 92 mg/dL (ref 70–99)
Potassium: 4 mmol/L (ref 3.5–5.1)
Sodium: 136 mmol/L (ref 135–145)

## 2020-12-18 LAB — CBC
HCT: 40.8 % (ref 36.0–46.0)
Hemoglobin: 13.8 g/dL (ref 12.0–15.0)
MCH: 31.9 pg (ref 26.0–34.0)
MCHC: 33.8 g/dL (ref 30.0–36.0)
MCV: 94.4 fL (ref 80.0–100.0)
Platelets: 261 10*3/uL (ref 150–400)
RBC: 4.32 MIL/uL (ref 3.87–5.11)
RDW: 12.8 % (ref 11.5–15.5)
WBC: 5.1 10*3/uL (ref 4.0–10.5)
nRBC: 0 % (ref 0.0–0.2)

## 2020-12-18 LAB — TROPONIN I (HIGH SENSITIVITY): Troponin I (High Sensitivity): <2 ng/L (ref <18)

## 2020-12-18 MED ORDER — KETOROLAC TROMETHAMINE 30 MG/ML IJ SOLN
30.0000 mg | Freq: Once | INTRAMUSCULAR | Status: AC
  Administered 2020-12-18: 30 mg via INTRAMUSCULAR
  Filled 2020-12-18: qty 1

## 2020-12-18 MED ORDER — MELOXICAM 15 MG PO TABS: 15.0000 mg | ORAL_TABLET | Freq: Every day | ORAL | 0 refills | Status: AC

## 2020-12-18 NOTE — ED Provider Notes (Signed)
[facility] EMERGENCY DEPARTMENT Provider Note   CSN: [id number] Arrival date & time: 12/18/20  1351     History Chief Complaint  Patient presents with   Chest Pain    Melinda Barnett is a 25 y.o. female presents to the emergency department for evaluation of chest pain and left knee pain.  Patient states around 1030 this morning she was at the grocery store when she was turning a corner if she felt a sharp pain in her left knee.  She describes her patella subluxing laterally.  She has had this happen several times before.  Never had a complete dislocation that needed to be reduced.  She had to sit down immediately following the left knee pain and developed some chest pain, shortness of breath, no numbness or tingling.  She has had a history of anxiety/panic attacks, most recently 1 that resemble her episode today.  Patient denies any diaphoresis, nausea, vomiting.  No history of cardiac issues.  No lower extremity swelling, calf pain, history of blood clots.  Patient has not take any medications for her knee.  She did use her albuterol inhaler as she felt as if she was wheezing and this did seem to help with the chest tightness and pain.  Her chest pain is mild, she describes mild tightness along the chest that is tender to touch along the sternum.  She is able to take good deep breaths with no wheezing.  She denies any numbness tingling or radicular symptoms.  No back pain.  Left knee pain is along the patellofemoral region and worse with weightbearing activity, flexion and full extension.  No effusion, warmth or redness.  No lower extremity swelling or edema.  HPI     Past Medical History:  Diagnosis Date   Anemia affecting pregnancy in second trimester, antepartum    Asthma    Headache 25 years old    Patient Active Problem List   Diagnosis Date Noted   Abdominal pain in pregnancy, third trimester 05/09/2018   Enteritis    Postpartum care following vaginal delivery  09/11/2015   Labor and delivery indication for care or intervention 09/10/2015   Decreased fetal movement 09/07/2015   Irregular uterine contractions 09/01/2015   Irregular contractions 09/01/2015   Abdominal pain affecting pregnancy 08/25/2015   Indication for care in labor and delivery, antepartum 08/08/2015   Labor and delivery, indication for care 07/10/2015   Preterm labor 06/12/2015   Pelvic pressure in pregnancy, antepartum 06/11/2015    Past Surgical History:  Procedure Laterality Date   NO PAST SURGERIES       OB History     Gravida  3   Para  0   Term  0   Preterm  0   AB  1   Living         SAB  1   IAB  0   Ectopic  0   Multiple      Live Births              Family History  Problem Relation Age of Onset   Heart disease Father    Stroke Father     Social History   Tobacco Use   Smoking status: Never   Smokeless tobacco: Never  Substance Use Topics   Alcohol use: Not Currently   Drug use: Not Currently    Home Medications Prior to Admission medications   Medication Sig Start Date End Date Taking? Authorizing Provider  meloxicam (  MOBIC) 15 MG tablet Take 1 tablet (15 mg total) by mouth daily. 12/18/20 12/18/21 Yes Evon Slack, PA-C  albuterol (PROVENTIL HFA;VENTOLIN HFA) 108 (90 Base) MCG/ACT inhaler Inhale 2 puffs into the lungs every 6 (six) hours as needed for wheezing or shortness of breath. 02/12/18   Merrily Brittle, MD  albuterol (VENTOLIN HFA) 108 (90 Base) MCG/ACT inhaler Inhale 2 puffs into the lungs every 6 (six) hours as needed for wheezing or shortness of breath.    [provider]  butalbital-acetaminophen-caffeine (FIORICET) [phone] MG tablet Take 1 tablet by mouth every 6 (six) hours as needed for headache. 10/17/20 10/17/21  Shaune Pollack, MD  doxylamine, Sleep, (UNISOM) 25 MG tablet Take 1 tablet (25 mg total) by mouth at bedtime as needed (nausea and vomiting). 11/14/17   Myrna Blazer, MD   Prenat-FeFum-FePo-FA-Omega 3 (TARON-C DHA) 53.5-38-1 MG CAPS Take 1 capsule by mouth daily. 10/29/17   [provider]  terconazole (TERAZOL 7) 0.4 % vaginal cream Place 1 applicator vaginally at bedtime. 05/09/18   McVey, Prudencio Pair, CNM  vitamin B-6 (PYRIDOXINE) 25 MG tablet Take 1 tablet (25 mg total) by mouth at bedtime as needed (nausea and vomiting). 11/14/17   Schaevitz, Myra Rude, MD    Allergies    Patient has no known allergies.  Review of Systems   Review of Systems  Constitutional:  Negative for chills, fatigue and fever.  Respiratory:  Positive for chest tightness and wheezing. Negative for cough, shortness of breath and stridor.   Cardiovascular:  Positive for chest pain. Negative for palpitations and leg swelling.  Gastrointestinal:  Negative for abdominal distention and abdominal pain.  Genitourinary:  Negative for dysuria.  Musculoskeletal:  Positive for arthralgias and gait problem. Negative for joint swelling and myalgias.  Skin:  Negative for rash and wound.  Neurological:  Negative for dizziness and headaches.   Physical Exam Updated Vital Signs BP 114/77   Pulse (!) 55   Temp 98.8 F (37.1 C) (Oral)   Resp 18   Ht 5\' 1"  (1.549 m)   Wt 77.1 kg   SpO2 99%   BMI 32.12 kg/m   Physical Exam Constitutional:      Appearance: She is well-developed.  HENT:     Head: Normocephalic and atraumatic.  Eyes:     Conjunctiva/sclera: Conjunctivae normal.  Cardiovascular:     Rate and Rhythm: Normal rate.     Heart sounds: Normal heart sounds.  Pulmonary:     Effort: Pulmonary effort is normal. No respiratory distress.     Breath sounds: Normal breath sounds. No stridor. No wheezing or rales.  Chest:     Chest wall: Tenderness (Tender along the chest wall with palpation along the mid sternum.) present.  Abdominal:     General: There is no distension.     Palpations: There is no mass.     Tenderness: There is no abdominal tenderness. There is no  guarding.     Hernia: No hernia is present.  Musculoskeletal:        General: Normal range of motion.     Cervical back: Normal range of motion.     Comments: Left lower extremity shows no swelling warmth erythema or edema.  Negative Homans' sign bilaterally.  She has full range of motion of the hip with no discomfort she has mild tenderness on the left hip trochanteric bursa.  No swelling.  She is tender along the patellofemoral region with positive patellofemoral grind test.  She has  0 to 125 degrees range of motion of the knee with mild discomfort along the patellofemoral region.  She has some patellar laxity on exam.  No laxity valgus varus stress testing.  Negative McMurray's test.  Skin:    General: Skin is warm.     Findings: No rash.  Neurological:     General: No focal deficit present.     Mental Status: She is alert and oriented to person, place, and time. Mental status is at baseline.     Cranial Nerves: No cranial nerve deficit.     Motor: No weakness.  Psychiatric:        Behavior: Behavior normal.        Thought Content: Thought content normal.    ED Results / Procedures / Treatments   Labs (all labs ordered are listed, but only abnormal results are displayed) Labs Reviewed  BASIC METABOLIC PANEL  CBC  POC URINE PREG, ED  TROPONIN I (HIGH SENSITIVITY)    EKG None  Radiology DG Chest 2 View  Result Date: 12/18/2020 CLINICAL DATA:  Chest pain. EXAM: CHEST - 2 VIEW COMPARISON:  October 17, 2020. FINDINGS: The heart size and mediastinal contours are within normal limits. Both lungs are clear. No visible pleural effusions or pneumothorax. No acute osseous abnormality. IMPRESSION: No active cardiopulmonary disease. Electronically Signed   By: Feliberto HartsFrederick S Jones MD   On: 12/18/2020 14:30   DG Knee AP/LAT W/Sunrise Left  Result Date: 12/18/2020 CLINICAL DATA:  25 year old female with patellar subluxation. EXAM: LEFT KNEE 3 VIEWS COMPARISON:  None. FINDINGS: No evidence of  fracture, dislocation, or joint effusion. No evidence of arthropathy or other focal bone abnormality. Soft tissues are unremarkable. IMPRESSION: Negative. Electronically Signed   By: Elgie CollardArash  Radparvar M.D.   On: 12/18/2020 16:45    Procedures Procedures   Medications Ordered in ED Medications  ketorolac (TORADOL) 30 MG/ML injection 30 mg (30 mg Intramuscular Given 12/18/20 1604)    ED Course  I have reviewed the triage vital signs and the nursing notes.  Pertinent labs & imaging results that were available during my care of the patient were reviewed by me and considered in my medical decision making (see chart for details).    MDM Rules/Calculators/A&P                          25 year old female with history and exam findings concerning for left knee patella subluxation that occurred earlier today.  Immediately after the episode she developed some chest discomfort, shortness of breath similar to previous panic attacks.  She also developed some wheezing which resolved with inhaler.  Vital signs are stable, afebrile with normal heart rate and rhythm.  O2 sats are normal.  She had some physical exam findings concerning for musculoskeletal chest wall pain which resolved with Toradol IM.  Patient without chest pain or shortness of breath.  She had a negative cardiac work-up with negative chest x-ray, EKG and cardiac labs.  She had x-rays of the left knee which were normal.  History and exam concerning for patella subluxation.  She is placed into a knee immobilizer, given crutches.  She will call orthopedic office For follow-up appointment. Final Clinical Impression(s) / ED Diagnoses Final diagnoses:  Chest wall pain  Anxiety  Recurrent subluxation of patella, left knee    Rx / DC Orders ED Discharge Orders          Ordered    meloxicam (MOBIC) 15 MG tablet  Daily        12/18/20 1656             Evon Slack, PA-C 12/18/20 1659    Phineas Semen, MD 12/18/20 [phone]

## 2020-12-18 NOTE — ED Triage Notes (Signed)
Pt come with c/o left leg weakness, SOB and CP. Pt states this started last night. Pt states sub sternal CP. Pt states she used her inhale with no relief.

## 2020-12-18 NOTE — ED Notes (Signed)
See triage note   Presents with chest tightness  States that started last pm but then eased off  Developed it again when she went outside in the heat  Also having pain to left leg  ,mainly to lateral left knee  Denies any injury but ambulates with slight limp

## 2020-12-18 NOTE — Discharge Instructions (Addendum)
Please take medication as prescribed.  Rest ice and elevate the left knee.  Use knee immobilizer and crutches as needed for ambulation until you can ambulate without a limp.  Call orthopedic office in 1 week to schedule follow-up appointment if no improvement

## 2021-01-01 DIAGNOSIS — M222X2 Patellofemoral disorders, left knee: Secondary | ICD-10-CM | POA: Diagnosis not present

## 2021-01-01 DIAGNOSIS — E669 Obesity, unspecified: Secondary | ICD-10-CM | POA: Diagnosis not present

## 2021-01-27 ENCOUNTER — Encounter: Payer: Self-pay | Admitting: Emergency Medicine

## 2021-01-27 ENCOUNTER — Other Ambulatory Visit: Payer: Self-pay

## 2021-01-27 ENCOUNTER — Emergency Department
Admission: EM | Admit: 2021-01-27 | Discharge: 2021-01-27 | Disposition: A | Discharge status: Home / Self Care | Payer: 59 | Attending: Emergency Medicine | Admitting: Emergency Medicine

## 2021-01-27 DIAGNOSIS — R519 Headache, unspecified: Secondary | ICD-10-CM | POA: Diagnosis not present

## 2021-01-27 DIAGNOSIS — J45909 Unspecified asthma, uncomplicated: Secondary | ICD-10-CM | POA: Diagnosis not present

## 2021-01-27 DIAGNOSIS — R112 Nausea with vomiting, unspecified: Secondary | ICD-10-CM | POA: Insufficient documentation

## 2021-01-27 DIAGNOSIS — Z79899 Other long term (current) drug therapy: Secondary | ICD-10-CM | POA: Diagnosis not present

## 2021-01-27 LAB — URINE DRUG SCREEN, QUALITATIVE (ARMC ONLY)
Amphetamines, Ur Screen: NOT DETECTED
Barbiturates, Ur Screen: NOT DETECTED
Benzodiazepine, Ur Scrn: NOT DETECTED
Cannabinoid 50 Ng, Ur ~~LOC~~: POSITIVE — AB
Cocaine Metabolite,Ur ~~LOC~~: NOT DETECTED
MDMA (Ecstasy)Ur Screen: NOT DETECTED
Methadone Scn, Ur: NOT DETECTED
Opiate, Ur Screen: NOT DETECTED
Phencyclidine (PCP) Ur S: NOT DETECTED
Tricyclic, Ur Screen: NOT DETECTED

## 2021-01-27 LAB — URINALYSIS, COMPLETE (UACMP) WITH MICROSCOPIC
Bacteria, UA: NONE SEEN
Bilirubin Urine: NEGATIVE
Glucose, UA: NEGATIVE mg/dL
Ketones, ur: NEGATIVE mg/dL
Leukocytes,Ua: NEGATIVE
Nitrite: NEGATIVE
Protein, ur: NEGATIVE mg/dL
Specific Gravity, Urine: 1.015 (ref 1.005–1.030)
pH: 6 (ref 5.0–8.0)

## 2021-01-27 LAB — COMPREHENSIVE METABOLIC PANEL
ALT: 14 U/L (ref 0–44)
AST: 14 U/L — ABNORMAL LOW (ref 15–41)
Albumin: 4.3 g/dL (ref 3.5–5.0)
Alkaline Phosphatase: 50 U/L (ref 38–126)
Anion gap: 10 (ref 5–15)
BUN: 8 mg/dL (ref 6–20)
CO2: 20 mmol/L — ABNORMAL LOW (ref 22–32)
Calcium: 9.2 mg/dL (ref 8.9–10.3)
Chloride: 112 mmol/L — ABNORMAL HIGH (ref 98–111)
Creatinine, Ser: 0.6 mg/dL (ref 0.44–1.00)
GFR, Estimated: >60 mL/min (ref >60)
Glucose, Bld: 106 mg/dL — ABNORMAL HIGH (ref 70–99)
Potassium: 3.8 mmol/L (ref 3.5–5.1)
Sodium: 142 mmol/L (ref 135–145)
Total Bilirubin: 0.4 mg/dL (ref 0.3–1.2)
Total Protein: 7.4 g/dL (ref 6.5–8.1)

## 2021-01-27 LAB — CBC
HCT: 41.1 % (ref 36.0–46.0)
Hemoglobin: 14 g/dL (ref 12.0–15.0)
MCH: 32.1 pg (ref 26.0–34.0)
MCHC: 34.1 g/dL (ref 30.0–36.0)
MCV: 94.3 fL (ref 80.0–100.0)
Platelets: 259 10*3/uL (ref 150–400)
RBC: 4.36 MIL/uL (ref 3.87–5.11)
RDW: 13.2 % (ref 11.5–15.5)
WBC: 6 10*3/uL (ref 4.0–10.5)
nRBC: 0 % (ref 0.0–0.2)

## 2021-01-27 LAB — LIPASE, BLOOD: Lipase: 29 U/L (ref 11–51)

## 2021-01-27 LAB — POC URINE PREG, ED: Preg Test, Ur: NEGATIVE

## 2021-01-27 MED ORDER — ACETAMINOPHEN 325 MG PO TABS
650.0000 mg | ORAL_TABLET | Freq: Once | ORAL | Status: AC
  Administered 2021-01-27: 650 mg via ORAL
  Filled 2021-01-27: qty 2

## 2021-01-27 MED ORDER — ONDANSETRON 4 MG PO TBDP: 4.0000 mg | ORAL_TABLET | Freq: Three times a day (TID) | ORAL | 0 refills | Status: DC | PRN

## 2021-01-27 MED ORDER — ONDANSETRON 4 MG PO TBDP
4.0000 mg | ORAL_TABLET | Freq: Once | ORAL | Status: AC
  Administered 2021-01-27: 4 mg via ORAL
  Filled 2021-01-27: qty 1

## 2021-01-27 MED ORDER — FAMOTIDINE 20 MG PO TABS
20.0000 mg | ORAL_TABLET | Freq: Once | ORAL | Status: AC
  Administered 2021-01-27: 20 mg via ORAL
  Filled 2021-01-27: qty 1

## 2021-01-27 NOTE — ED Notes (Signed)
See triage note   States she developed some generalized stomach pain with n/v about 3 am  Also is having a h/a    No fever
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
## 2021-01-27 NOTE — ED Triage Notes (Signed)
Pt comes into the ED via POV c/o abdominal pain, nausea, and headache.  Pt does admit to ETOH use last night.  Pt ambulatory to triage at this time and in NAD.

## 2021-01-27 NOTE — Discharge Instructions (Addendum)
Your exam and labs are normal and reassuring at this time.  Follow-up with your primary provider return to the ED if needed.  Use the nausea medicine as needed for relief of nausea symptoms.  Continue to hydrate using Gatorade/Powerade, chicken broth, and soft foods.  Advance diet as tolerated.

## 2021-02-04 DIAGNOSIS — U071 COVID-19: Secondary | ICD-10-CM | POA: Diagnosis not present

## 2021-02-04 DIAGNOSIS — B9689 Other specified bacterial agents as the cause of diseases classified elsewhere: Secondary | ICD-10-CM | POA: Diagnosis not present

## 2021-02-04 DIAGNOSIS — J019 Acute sinusitis, unspecified: Secondary | ICD-10-CM | POA: Diagnosis not present

## 2021-02-04 DIAGNOSIS — Z03818 Encounter for observation for suspected exposure to other biological agents ruled out: Secondary | ICD-10-CM | POA: Diagnosis not present

## 2021-04-12 ENCOUNTER — Other Ambulatory Visit: Payer: 59

## 2021-10-26 IMAGING — CR DG CHEST 2V
2 series · 2 of 2 positions shown · non-contrast
Comparison: October 17, 2020.

CLINICAL DATA: Chest pain.

EXAM:
CHEST - 2 VIEW

[chest pa]
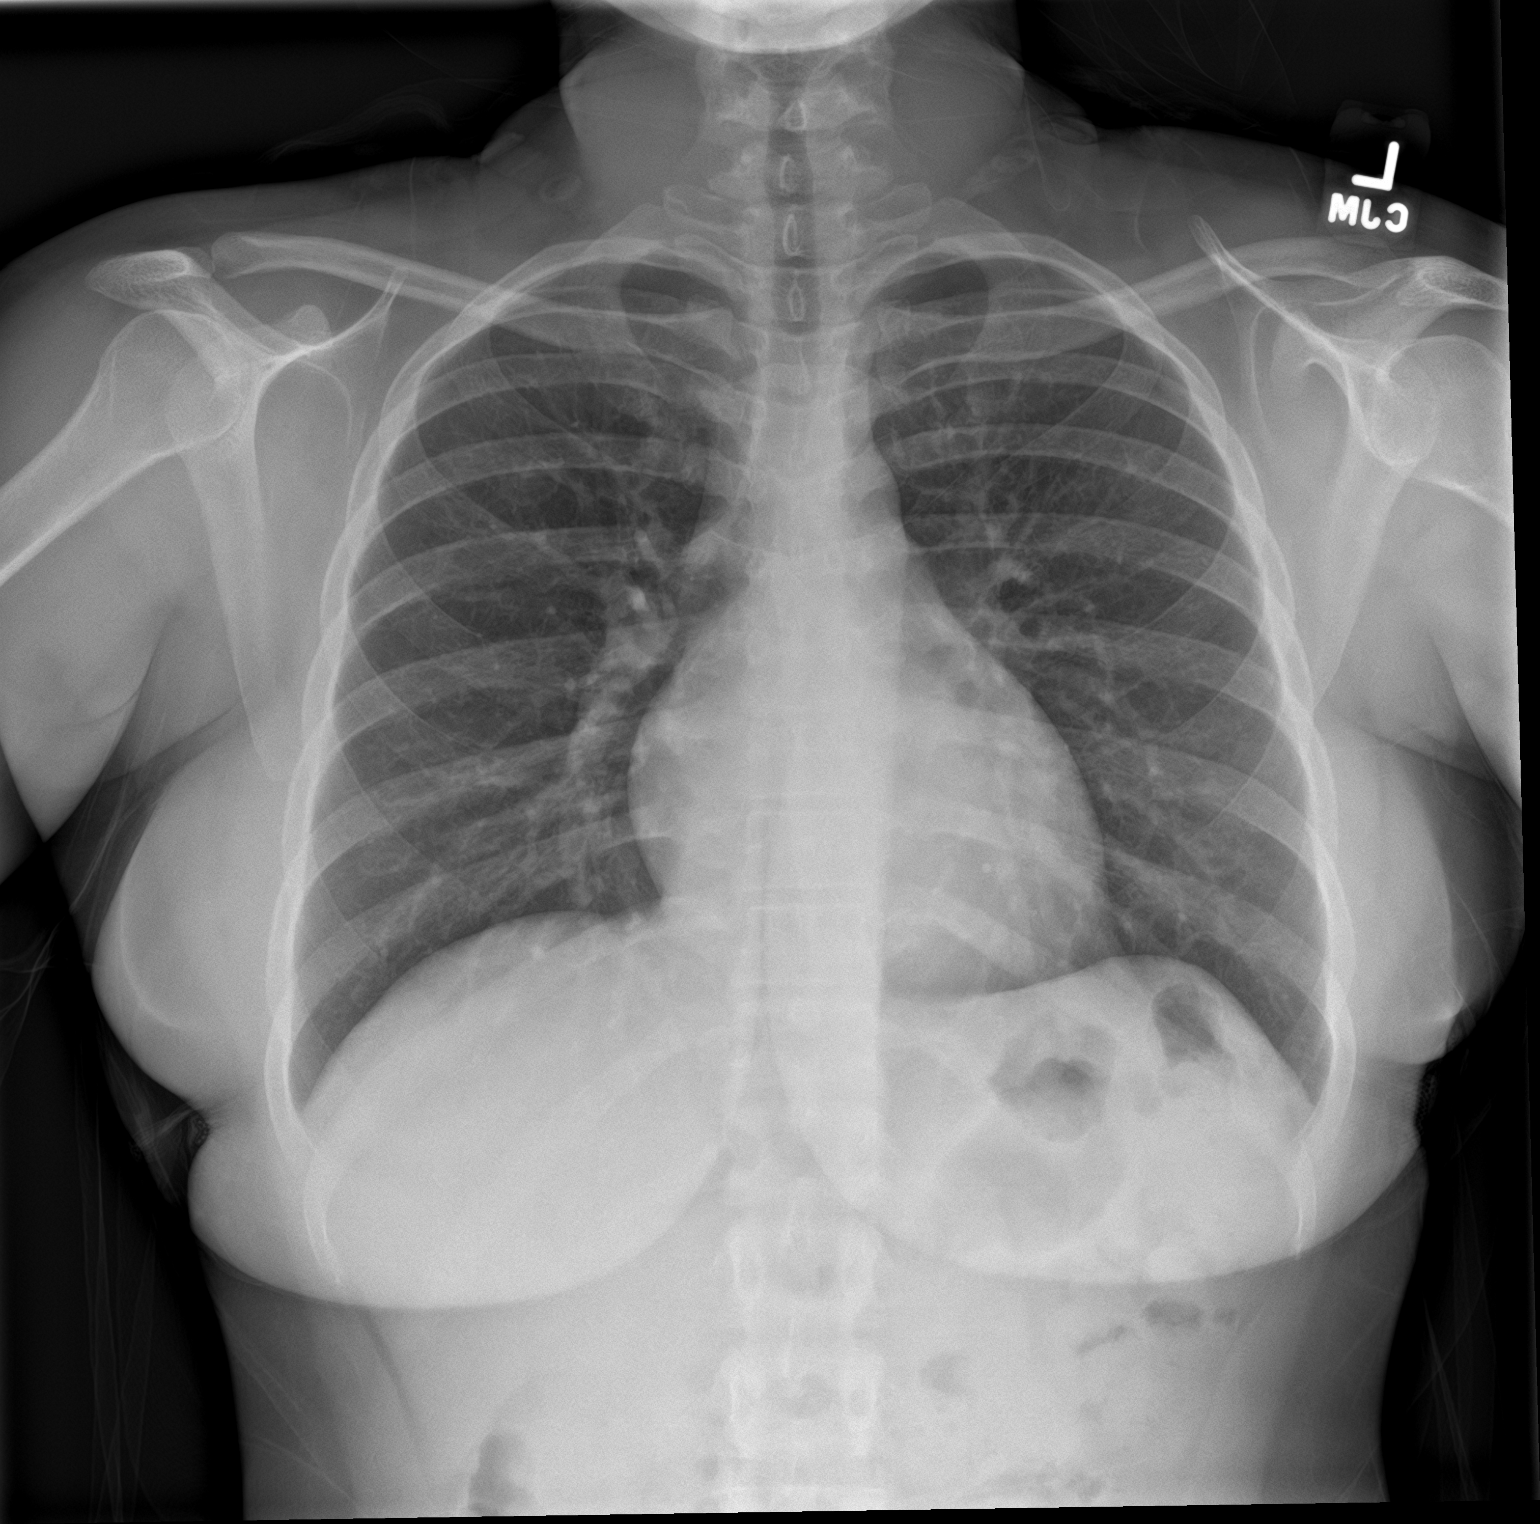

[chest lat]
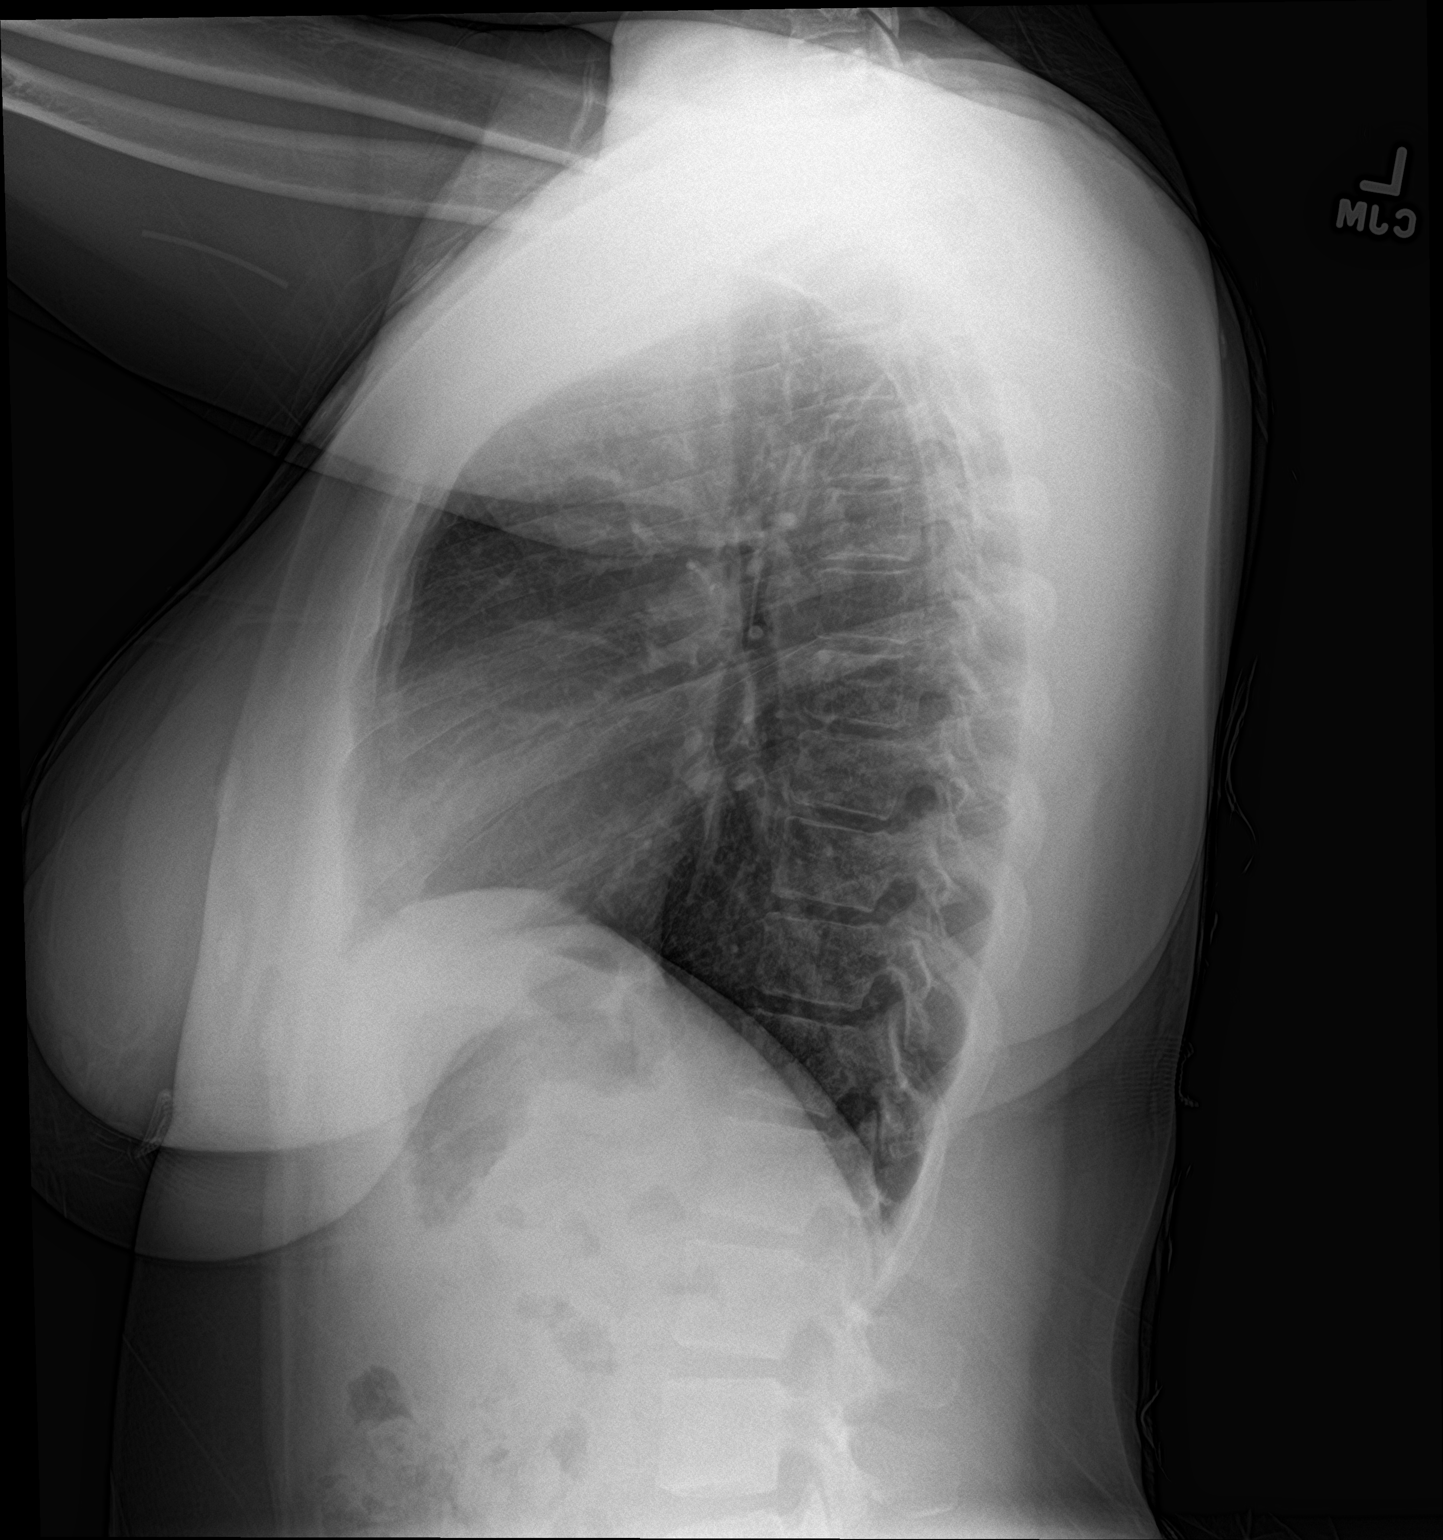

[2 of 2 positions shown; findings below may reference images not displayed]

FINDINGS: The heart size and mediastinal contours are within normal limits.
Both lungs are clear. No visible pleural effusions or pneumothorax.
No acute osseous abnormality.
IMPRESSION: No active cardiopulmonary disease.

## 2022-08-29 DIAGNOSIS — Z01419 Encounter for gynecological examination (general) (routine) without abnormal findings: Secondary | ICD-10-CM | POA: Diagnosis not present

## 2022-10-06 DIAGNOSIS — Z419 Encounter for procedure for purposes other than remedying health state, unspecified: Secondary | ICD-10-CM | POA: Diagnosis not present

## 2022-11-05 DIAGNOSIS — Z419 Encounter for procedure for purposes other than remedying health state, unspecified: Secondary | ICD-10-CM | POA: Diagnosis not present

## 2022-12-06 DIAGNOSIS — Z419 Encounter for procedure for purposes other than remedying health state, unspecified: Secondary | ICD-10-CM | POA: Diagnosis not present

## 2023-01-05 DIAGNOSIS — Z419 Encounter for procedure for purposes other than remedying health state, unspecified: Secondary | ICD-10-CM | POA: Diagnosis not present

## 2023-02-05 DIAGNOSIS — Z419 Encounter for procedure for purposes other than remedying health state, unspecified: Secondary | ICD-10-CM | POA: Diagnosis not present

## 2023-03-08 DIAGNOSIS — Z419 Encounter for procedure for purposes other than remedying health state, unspecified: Secondary | ICD-10-CM | POA: Diagnosis not present

## 2023-04-07 DIAGNOSIS — Z419 Encounter for procedure for purposes other than remedying health state, unspecified: Secondary | ICD-10-CM | POA: Diagnosis not present

## 2023-04-17 ENCOUNTER — Encounter: Payer: Self-pay | Admitting: Family Medicine

## 2023-04-17 ENCOUNTER — Ambulatory Visit: Payer: Commercial Managed Care - PPO | Admitting: Family Medicine

## 2023-04-17 VITALS — BP 110/75 | HR 61 | Temp 98.2°F | Resp 18 | Ht 62.0 in | Wt 173.0 lb

## 2023-04-17 DIAGNOSIS — J4520 Mild intermittent asthma, uncomplicated: Secondary | ICD-10-CM | POA: Insufficient documentation

## 2023-04-17 DIAGNOSIS — Z0000 Encounter for general adult medical examination without abnormal findings: Secondary | ICD-10-CM | POA: Insufficient documentation

## 2023-04-17 DIAGNOSIS — Z7689 Persons encountering health services in other specified circumstances: Secondary | ICD-10-CM | POA: Insufficient documentation

## 2023-04-17 DIAGNOSIS — F39 Unspecified mood [affective] disorder: Secondary | ICD-10-CM | POA: Diagnosis not present

## 2023-04-17 DIAGNOSIS — Z23 Encounter for immunization: Secondary | ICD-10-CM | POA: Diagnosis not present

## 2023-04-17 DIAGNOSIS — R519 Headache, unspecified: Secondary | ICD-10-CM

## 2023-04-17 DIAGNOSIS — Z6831 Body mass index (BMI) 31.0-31.9, adult: Secondary | ICD-10-CM

## 2023-04-17 DIAGNOSIS — R5383 Other fatigue: Secondary | ICD-10-CM

## 2023-04-17 DIAGNOSIS — Z0001 Encounter for general adult medical examination with abnormal findings: Secondary | ICD-10-CM | POA: Diagnosis not present

## 2023-04-17 DIAGNOSIS — Z833 Family history of diabetes mellitus: Secondary | ICD-10-CM | POA: Insufficient documentation

## 2023-04-17 HISTORY — DX: Unspecified mood (affective) disorder: F39

## 2023-04-17 MED ORDER — BUSPIRONE HCL 5 MG PO TABS: 5.0000 mg | ORAL_TABLET | Freq: Three times a day (TID) | ORAL | 0 refills | Status: DC | PRN

## 2023-04-17 MED ORDER — BUPROPION HCL ER (XL) 150 MG PO TB24: 150.0000 mg | ORAL_TABLET | Freq: Every day | ORAL | 1 refills | Status: DC

## 2023-04-17 MED ORDER — BUTALBITAL-APAP-CAFFEINE 50-325-40 MG PO TABS: 1.0000 | ORAL_TABLET | Freq: Four times a day (QID) | ORAL | 1 refills | Status: AC | PRN

## 2023-04-17 MED ORDER — ALBUTEROL SULFATE HFA 108 (90 BASE) MCG/ACT IN AERS: 2.0000 | INHALATION_SPRAY | Freq: Four times a day (QID) | RESPIRATORY_TRACT | 11 refills | Status: AC | PRN

## 2023-04-17 NOTE — Assessment & Plan Note (Signed)
Chronic, Body mass index is 31.64 kg/m. Discussed importance of healthy weight management Discussed diet and exercise

## 2023-04-17 NOTE — Progress Notes (Signed)
New patient visit   Patient: Melinda Barnett   DOB: Dec 24, 1995   27 y.o. Female  MRN: [id number] Visit Date: 04/17/2023  Today's healthcare provider: Jacky Kindle, FNP  Patient presents for new patient visit to establish care.  Introduced to Publishing rights manager role and practice setting.  All questions answered.  Discussed provider/patient relationship and expectations.   Chief Complaint  Patient presents with   Establish Care    Having really bad headaches everyday and nothing OTC helps and tiredness    Subjective    Melinda Barnett is a 27 y.o. female who presents today as a new patient to establish care.  HPI HPI     Establish Care    Additional comments: Having really bad headaches everyday and nothing OTC helps and tiredness       Last edited by Clois Comber on 04/17/2023  3:14 PM.      Complaints of headaches -daily -present upon awaking; can come throughout the day -sometimes at night -tylenol 1000 mg q8 hours -nsaids, 1200 mg; provides some relief   Complaints of fatigue Complaints of stress  Past Medical History:  Diagnosis Date   Anemia affecting pregnancy in second trimester, antepartum    Asthma    Headache 27 years old   Mood disorder (HCC) 04/17/2023   Past Surgical History:  Procedure Laterality Date   NO PAST SURGERIES     Family Status  Relation Name Status   Mother  Alive   Father  Alive  No partnership data on file   Family History  Problem Relation Age of Onset   Heart disease Father    Stroke Father    Diabetes Father    Social History   Socioeconomic History   Marital status: Married    Spouse name: Not on file   Number of children: Not on file   Years of education: Not on file   Highest education level: Not on file  Occupational History   Not on file  Tobacco Use   Smoking status: Never   Smokeless tobacco: Never  Substance and Sexual Activity   Alcohol use: Not Currently    Alcohol/week: 1.0 standard drink of alcohol     Types: 1 Glasses of wine per week   Drug use: Not Currently    Types: Marijuana   Sexual activity: Yes    Birth control/protection: Implant  Other Topics Concern   Not on file  Social History Narrative   ** Merged History Encounter **       Social Determinants of Health   Financial Resource Strain: Not on file  Food Insecurity: Not on file  Transportation Needs: Not on file  Physical Activity: Not on file  Stress: Not on file  Social Connections: Not on file   Outpatient Medications Prior to Visit  Medication Sig Note   [DISCONTINUED] albuterol (PROVENTIL HFA;VENTOLIN HFA) 108 (90 Base) MCG/ACT inhaler Inhale 2 puffs into the lungs every 6 (six) hours as needed for wheezing or shortness of breath. (Patient not taking: Reported on 04/17/2023) 04/17/2023: But needs refill   [DISCONTINUED] albuterol (VENTOLIN HFA) 108 (90 Base) MCG/ACT inhaler Inhale 2 puffs into the lungs every 6 (six) hours as needed for wheezing or shortness of breath. (Patient not taking: Reported on 04/17/2023) 04/17/2023: Needs refill   [DISCONTINUED] doxylamine, Sleep, (UNISOM) 25 MG tablet Take 1 tablet (25 mg total) by mouth at bedtime as needed (nausea and vomiting). (Patient not taking: Reported on 04/17/2023)    [  DISCONTINUED] ondansetron (ZOFRAN ODT) 4 MG disintegrating tablet Take 1 tablet (4 mg total) by mouth every 8 (eight) hours as needed. (Patient not taking: Reported on 04/17/2023)    [DISCONTINUED] Prenat-FeFum-FePo-FA-Omega 3 (TARON-C DHA) 53.5-38-1 MG CAPS Take 1 capsule by mouth daily. (Patient not taking: Reported on 04/17/2023)    [DISCONTINUED] terconazole (TERAZOL 7) 0.4 % vaginal cream Place 1 applicator vaginally at bedtime. (Patient not taking: Reported on 04/17/2023)    [DISCONTINUED] vitamin B-6 (PYRIDOXINE) 25 MG tablet Take 1 tablet (25 mg total) by mouth at bedtime as needed (nausea and vomiting). (Patient not taking: Reported on 04/17/2023)    No facility-administered medications  prior to visit.   No Known Allergies  Immunization History  Administered Date(s) Administered   Influenza, Seasonal, Injecte, Preservative Fre 04/17/2023   MMR 09/12/2015   PFIZER Comirnaty(Gray Top)Covid-19 Tri-Sucrose Vaccine 12/07/2019, 12/28/2019   Tdap 03/23/2018   Varicella 09/12/2015    Health Maintenance  Topic Date Due   Hepatitis C Screening  Never done   COVID-19 Vaccine (3 - 2023-24 season) 03/08/2023   Cervical Cancer Screening (Pap smear)  08/29/2025   DTaP/Tdap/Td (2 - Td or Tdap) 03/23/2028   INFLUENZA VACCINE  Completed   HIV Screening  Completed   HPV VACCINES  Aged Out   Patient Care Team: Jacky Kindle, FNP as PCP - General (Family Medicine) Patient, No Pcp Per (General Practice)   Objective    BP 110/75 (BP Location: Left Arm, Patient Position: Sitting, Cuff Size: Normal)   Pulse 61   Temp 98.2 F (36.8 C)   Resp 18   Ht 5\' 2"  (1.575 m)   Wt 173 lb (78.5 kg)   SpO2 100%   Breastfeeding No   BMI 31.64 kg/m   Physical Exam Vitals and nursing note reviewed.  Constitutional:      General: She is awake. She is not in acute distress.    Appearance: Normal appearance. She is well-developed and well-groomed. She is obese. She is not ill-appearing, toxic-appearing or diaphoretic.  HENT:     Head: Normocephalic and atraumatic.     Jaw: There is normal jaw occlusion. No trismus, tenderness, swelling or pain on movement.     Right Ear: Hearing, tympanic membrane, ear canal and external ear normal. There is no impacted cerumen.     Left Ear: Hearing, tympanic membrane, ear canal and external ear normal. There is no impacted cerumen.     Nose: Nose normal. No congestion or rhinorrhea.     Right Turbinates: Not enlarged, swollen or pale.     Left Turbinates: Not enlarged, swollen or pale.     Right Sinus: No maxillary sinus tenderness or frontal sinus tenderness.     Left Sinus: No maxillary sinus tenderness or frontal sinus tenderness.     Mouth/Throat:      Lips: Pink.     Mouth: Mucous membranes are moist. No injury.     Tongue: No lesions.     Pharynx: Oropharynx is clear. Uvula midline. No pharyngeal swelling, oropharyngeal exudate, posterior oropharyngeal erythema or uvula swelling.     Tonsils: No tonsillar exudate or tonsillar abscesses.  Eyes:     General: Lids are normal. Lids are everted, no foreign bodies appreciated. Vision grossly intact. Gaze aligned appropriately. No allergic shiner or visual field deficit.       Right eye: No discharge.        Left eye: No discharge.     Extraocular Movements: Extraocular movements intact.  Conjunctiva/sclera: Conjunctivae normal.     Right eye: Right conjunctiva is not injected. No exudate.    Left eye: Left conjunctiva is not injected. No exudate.    Pupils: Pupils are equal, round, and reactive to light.  Neck:     Thyroid: No thyroid mass, thyromegaly or thyroid tenderness.     Vascular: No carotid bruit.     Trachea: Trachea normal.  Cardiovascular:     Rate and Rhythm: Normal rate and regular rhythm.     Pulses: Normal pulses.          Carotid pulses are 2+ on the right side and 2+ on the left side.      Radial pulses are 2+ on the right side and 2+ on the left side.       Dorsalis pedis pulses are 2+ on the right side and 2+ on the left side.       Posterior tibial pulses are 2+ on the right side and 2+ on the left side.     Heart sounds: Normal heart sounds, S1 normal and S2 normal. No murmur heard.    No friction rub. No gallop.  Pulmonary:     Effort: Pulmonary effort is normal. No respiratory distress.     Breath sounds: Normal breath sounds and air entry. No stridor. No wheezing, rhonchi or rales.  Chest:     Chest wall: No tenderness.  Abdominal:     General: Abdomen is flat. Bowel sounds are normal. There is no distension.     Palpations: Abdomen is soft. There is no mass.     Tenderness: There is no abdominal tenderness. There is no right CVA tenderness, left CVA  tenderness, guarding or rebound.     Hernia: No hernia is present.  Genitourinary:    Comments: Exam deferred; denies complaints Musculoskeletal:        General: No swelling, tenderness, deformity or signs of injury. Normal range of motion.     Cervical back: Full passive range of motion without pain, normal range of motion and neck supple. No edema, rigidity or tenderness. No muscular tenderness.     Right lower leg: No edema.     Left lower leg: No edema.  Lymphadenopathy:     Cervical: No cervical adenopathy.     Right cervical: No superficial, deep or posterior cervical adenopathy.    Left cervical: No superficial, deep or posterior cervical adenopathy.  Skin:    General: Skin is warm and dry.     Capillary Refill: Capillary refill takes less than 2 seconds.     Coloration: Skin is not jaundiced or pale.     Findings: No bruising, erythema, lesion or rash.  Neurological:     General: No focal deficit present.     Mental Status: She is alert and oriented to person, place, and time. Mental status is at baseline.     GCS: GCS eye subscore is 4. GCS verbal subscore is 5. GCS motor subscore is 6.     Cranial Nerves: No cranial nerve deficit.     Sensory: Sensation is intact. No sensory deficit.     Motor: Motor function is intact. No weakness.     Coordination: Coordination is intact. Coordination normal.     Gait: Gait is intact. Gait normal.  Psychiatric:        Attention and Perception: Attention and perception normal.        Mood and Affect: Mood and affect normal.  Speech: Speech normal.        Behavior: Behavior normal. Behavior is cooperative.        Thought Content: Thought content normal.        Cognition and Memory: Cognition and memory normal.        Judgment: Judgment normal.    Depression Screen    04/17/2023    4:49 PM  PHQ 2/9 Scores  PHQ - 2 Score 1  PHQ- 9 Score 7   No results found for any visits on 04/17/23.  Assessment & Plan      Problem List  Items Addressed This Visit       Respiratory   Mild intermittent asthma without complication    Chronic, currently untreated Advised that MJ use daily will NOT assist asthma; request for PRN inhaler No acute symptoms Continue to monitor       Relevant Medications   albuterol (VENTOLIN HFA) 108 (90 Base) MCG/ACT inhaler     Other   Annual physical exam    Things to do to keep yourself healthy  - Exercise at least 30-45 minutes a day, 3-4 days a week.  - Eat a low-fat diet with lots of fruits and vegetables, up to 7-9 servings per day.  - Seatbelts can save your life. Wear them always.  - Smoke detectors on every level of your home, check batteries every year.  - Eye Doctor - have an eye exam every 1-2 years  - Safe sex - if you may be exposed to STDs, use a condom.  - Alcohol -  If you drink, do it moderately, less than 2 drinks per day.  - Health Care Power of Attorney. Choose someone to speak for you if you are not able.  - Depression is common in our stressful world.If you're feeling down or losing interest in things you normally enjoy, please come in for a visit.  - Violence - If anyone is threatening or hurting you, please call immediately.       BMI 31.0-31.9,adult    Chronic, Body mass index is 31.64 kg/m. Discussed importance of healthy weight management Discussed diet and exercise       Relevant Medications   buPROPion (WELLBUTRIN XL) 150 MG 24 hr tablet   butalbital-acetaminophen-caffeine (FIORICET) 50-325-40 MG tablet   busPIRone (BUSPAR) 5 MG tablet   Other Relevant Orders   CBC with Differential/Platelet   Comprehensive Metabolic Panel (CMET)   TSH   Vitamin D (25 hydroxy)   Hepatitis C Antibody   Hemoglobin A1c   B12 and Folate Panel   Lipid panel   Daily headache    Likely rebound headaches from acute stress and misuse of NSAIDs; advised to stop NSAIDs and only to use 200-400 mg as needed given r/f abdominal bleeding Trial of Rx medication to  assist Handout provided RTC as needed       Relevant Medications   buPROPion (WELLBUTRIN XL) 150 MG 24 hr tablet   butalbital-acetaminophen-caffeine (FIORICET) 50-325-40 MG tablet   busPIRone (BUSPAR) 5 MG tablet   Other Relevant Orders   CBC with Differential/Platelet   Comprehensive Metabolic Panel (CMET)   TSH   Vitamin D (25 hydroxy)   Hepatitis C Antibody   Hemoglobin A1c   B12 and Folate Panel   Lipid panel   Encounter for immunization    Request for FLU      Relevant Orders   Flu vaccine trivalent PF, 6mos and older(Flulaval,Afluria,Fluarix,Fluzone) (Completed)   Establishing care with new  doctor, encounter for    C/o headaches, fatigue; appears pt is under a lot of stress      Relevant Medications   buPROPion (WELLBUTRIN XL) 150 MG 24 hr tablet   butalbital-acetaminophen-caffeine (FIORICET) 50-325-40 MG tablet   busPIRone (BUSPAR) 5 MG tablet   Other Relevant Orders   CBC with Differential/Platelet   Comprehensive Metabolic Panel (CMET)   TSH   Vitamin D (25 hydroxy)   Hepatitis C Antibody   Hemoglobin A1c   B12 and Folate Panel   Lipid panel   Family history of diabetes mellitus    Request A1c; Continue to recommend balanced, lower carb meals. Smaller meal size, adding snacks. Choosing water as drink of choice and increasing purposeful exercise.       Relevant Medications   buPROPion (WELLBUTRIN XL) 150 MG 24 hr tablet   butalbital-acetaminophen-caffeine (FIORICET) 50-325-40 MG tablet   busPIRone (BUSPAR) 5 MG tablet   Other Relevant Orders   CBC with Differential/Platelet   Comprehensive Metabolic Panel (CMET)   TSH   Vitamin D (25 hydroxy)   Hepatitis C Antibody   Hemoglobin A1c   B12 and Folate Panel   Lipid panel   Fatigue    Chronic, unknown reports "months of systems" -upon further questioning, pt reports she is the only provider for their family and that spouse is injuried and needs surgery which is taxing her physically by picking up extra  shifts, emotionally taking care of both kids and mentally d/t financial concerns       Relevant Medications   buPROPion (WELLBUTRIN XL) 150 MG 24 hr tablet   butalbital-acetaminophen-caffeine (FIORICET) 50-325-40 MG tablet   busPIRone (BUSPAR) 5 MG tablet   Other Relevant Orders   CBC with Differential/Platelet   Comprehensive Metabolic Panel (CMET)   TSH   Vitamin D (25 hydroxy)   Hepatitis C Antibody   Hemoglobin A1c   B12 and Folate Panel   Lipid panel   Mood disorder (HCC) - Primary    Acute on chronic, both PHQ and GAD elevated Trial of daily medication and as needed medication to assist I've explained to her that drugs of the SSRI class can have side effects such as weight gain, sexual dysfunction, insomnia, headache, nausea. These medications are generally effective at alleviating symptoms of anxiety and/or depression. Let me know if significant side effects do occur. Denies any concern for SI, SA or HI, HA       Return in about 6 weeks (around 05/29/2023) for anxiety and depression.    Leilani Merl, FNP, have reviewed all documentation for this visit. The documentation on 04/17/23 for the exam, diagnosis, procedures, and orders are all accurate and complete.  Jacky Kindle, FNP  [facility] [phone] (phone) [phone] (fax)  [facility]

## 2023-04-17 NOTE — Assessment & Plan Note (Signed)
Request A1c; Continue to recommend balanced, lower carb meals. Smaller meal size, adding snacks. Choosing water as drink of choice and increasing purposeful exercise.

## 2023-04-17 NOTE — Assessment & Plan Note (Signed)
Chronic, currently untreated Advised that MJ use daily will NOT assist asthma; request for PRN inhaler No acute symptoms Continue to monitor

## 2023-04-17 NOTE — Assessment & Plan Note (Signed)
Chronic, unknown reports "months of systems" -upon further questioning, pt reports she is the only provider for their family and that spouse is injuried and needs surgery which is taxing her physically by picking up extra shifts, emotionally taking care of both kids and mentally d/t financial concerns

## 2023-04-17 NOTE — Assessment & Plan Note (Signed)
Request for FLU

## 2023-04-17 NOTE — Assessment & Plan Note (Signed)

## 2023-04-17 NOTE — Assessment & Plan Note (Signed)
Likely rebound headaches from acute stress and misuse of NSAIDs; advised to stop NSAIDs and only to use 200-400 mg as needed given r/f abdominal bleeding Trial of Rx medication to assist Handout provided RTC as needed

## 2023-04-17 NOTE — Assessment & Plan Note (Addendum)
Acute on chronic, both PHQ and GAD elevated Trial of daily medication and as needed medication to assist I've explained to her that drugs of the SSRI class can have side effects such as weight gain, sexual dysfunction, insomnia, headache, nausea. These medications are generally effective at alleviating symptoms of anxiety and/or depression. Let me know if significant side effects do occur. Denies any concern for SI, SA or HI, HA

## 2023-04-17 NOTE — Assessment & Plan Note (Signed)
C/o headaches, fatigue; appears pt is under a lot of stress

## 2023-04-18 LAB — HEMOGLOBIN A1C
Est. average glucose Bld gHb Est-mCnc: 111 mg/dL
Hgb A1c MFr Bld: 5.5 % (ref 4.8–5.6)

## 2023-04-18 LAB — LIPID PANEL
Chol/HDL Ratio: 2.9 {ratio} (ref 0.0–4.4)
Cholesterol, Total: 166 mg/dL (ref 100–199)
HDL: 57 mg/dL (ref >39)
LDL Chol Calc (NIH): 99 mg/dL (ref 0–99)
Triglycerides: 47 mg/dL (ref 0–149)
VLDL Cholesterol Cal: 10 mg/dL (ref 5–40)

## 2023-04-18 LAB — COMPREHENSIVE METABOLIC PANEL
ALT: 15 [IU]/L (ref 0–32)
AST: 15 [IU]/L (ref 0–40)
Albumin: 4.5 g/dL (ref 4.0–5.0)
Alkaline Phosphatase: 67 [IU]/L (ref 44–121)
BUN/Creatinine Ratio: 9 (ref 9–23)
BUN: 7 mg/dL (ref 6–20)
Bilirubin Total: 0.4 mg/dL (ref 0.0–1.2)
CO2: 21 mmol/L (ref 20–29)
Calcium: 9.4 mg/dL (ref 8.7–10.2)
Chloride: 105 mmol/L (ref 96–106)
Creatinine, Ser: 0.76 mg/dL (ref 0.57–1.00)
Globulin, Total: 2.1 g/dL (ref 1.5–4.5)
Glucose: 80 mg/dL (ref 70–99)
Potassium: 4 mmol/L (ref 3.5–5.2)
Sodium: 140 mmol/L (ref 134–144)
Total Protein: 6.6 g/dL (ref 6.0–8.5)
eGFR: 109 mL/min/{1.73_m2} (ref >59)

## 2023-04-18 LAB — CBC WITH DIFFERENTIAL/PLATELET
Basophils Absolute: 0 10*3/uL (ref 0.0–0.2)
Basos: 1 %
EOS (ABSOLUTE): 0.3 10*3/uL (ref 0.0–0.4)
Eos: 6 %
Hematocrit: 43.1 % (ref 34.0–46.6)
Hemoglobin: 13.9 g/dL (ref 11.1–15.9)
Immature Grans (Abs): 0 10*3/uL (ref 0.0–0.1)
Immature Granulocytes: 0 %
Lymphocytes Absolute: 2.6 10*3/uL (ref 0.7–3.1)
Lymphs: 50 %
MCH: 31.7 pg (ref 26.6–33.0)
MCHC: 32.3 g/dL (ref 31.5–35.7)
MCV: 98 fL — ABNORMAL HIGH (ref 79–97)
Monocytes Absolute: 0.3 10*3/uL (ref 0.1–0.9)
Monocytes: 5 %
Neutrophils Absolute: 1.9 10*3/uL (ref 1.4–7.0)
Neutrophils: 38 %
Platelets: 256 10*3/uL (ref 150–450)
RBC: 4.39 x10E6/uL (ref 3.77–5.28)
RDW: 12.2 % (ref 11.7–15.4)
WBC: 5.1 10*3/uL (ref 3.4–10.8)

## 2023-04-18 LAB — B12 AND FOLATE PANEL
Folate: 11.6 ng/mL (ref >3.0)
Vitamin B-12: 380 pg/mL (ref 232–1245)

## 2023-04-18 LAB — VITAMIN D 25 HYDROXY (VIT D DEFICIENCY, FRACTURES): Vit D, 25-Hydroxy: 14.5 ng/mL — ABNORMAL LOW (ref 30.0–100.0)

## 2023-04-18 LAB — HEPATITIS C ANTIBODY: Hep C Virus Ab: NONREACTIVE

## 2023-04-18 LAB — TSH: TSH: 0.397 u[IU]/mL — ABNORMAL LOW (ref 0.450–4.500)

## 2023-04-19 NOTE — Progress Notes (Signed)
Borderline low TSH; this may be causing some of the anxiety symptoms. Recommend repeat testing in 3 months. Recommend high dose Vit D 5000 IU daily available OTC.

## 2023-04-21 ENCOUNTER — Ambulatory Visit: Payer: Commercial Managed Care - HMO | Admitting: Physician Assistant

## 2023-05-08 DIAGNOSIS — Z419 Encounter for procedure for purposes other than remedying health state, unspecified: Secondary | ICD-10-CM | POA: Diagnosis not present

## 2023-05-29 ENCOUNTER — Ambulatory Visit (INDEPENDENT_AMBULATORY_CARE_PROVIDER_SITE_OTHER): Payer: Commercial Managed Care - PPO | Admitting: Family Medicine

## 2023-05-29 DIAGNOSIS — Z91199 Patient's noncompliance with other medical treatment and regimen due to unspecified reason: Secondary | ICD-10-CM

## 2023-05-31 NOTE — Progress Notes (Signed)
Patient was not seen for appt d/t no call, no show, or late arrival >10 mins past appt time.   Elise T Payne, FNP  [facility] [address] [place], [place] [address] [phone] (phone) [phone] (fax) [facility]  

## 2023-06-07 DIAGNOSIS — Z419 Encounter for procedure for purposes other than remedying health state, unspecified: Secondary | ICD-10-CM | POA: Diagnosis not present

## 2023-07-08 DIAGNOSIS — Z419 Encounter for procedure for purposes other than remedying health state, unspecified: Secondary | ICD-10-CM | POA: Diagnosis not present

## 2023-08-08 DIAGNOSIS — Z419 Encounter for procedure for purposes other than remedying health state, unspecified: Secondary | ICD-10-CM | POA: Diagnosis not present

## 2023-09-05 DIAGNOSIS — Z419 Encounter for procedure for purposes other than remedying health state, unspecified: Secondary | ICD-10-CM | POA: Diagnosis not present

## 2023-09-18 DIAGNOSIS — S60151A Contusion of right little finger with damage to nail, initial encounter: Secondary | ICD-10-CM | POA: Diagnosis not present

## 2023-09-18 DIAGNOSIS — S6991XA Unspecified injury of right wrist, hand and finger(s), initial encounter: Secondary | ICD-10-CM | POA: Diagnosis not present

## 2023-10-17 DIAGNOSIS — Z419 Encounter for procedure for purposes other than remedying health state, unspecified: Secondary | ICD-10-CM | POA: Diagnosis not present

## 2023-10-22 ENCOUNTER — Ambulatory Visit (INDEPENDENT_AMBULATORY_CARE_PROVIDER_SITE_OTHER): Admitting: Family Medicine

## 2023-10-22 ENCOUNTER — Encounter: Payer: Self-pay | Admitting: Family Medicine

## 2023-10-22 VITALS — BP 94/64 | HR 62 | Ht 62.0 in | Wt 183.0 lb

## 2023-10-22 DIAGNOSIS — R519 Headache, unspecified: Secondary | ICD-10-CM | POA: Diagnosis not present

## 2023-10-22 MED ORDER — PREDNISONE 20 MG PO TABS: ORAL_TABLET | ORAL | 0 refills | Status: AC

## 2023-10-22 MED ORDER — SUMATRIPTAN SUCCINATE 25 MG PO TABS: 25.0000 mg | ORAL_TABLET | ORAL | 0 refills | Status: AC | PRN

## 2023-10-22 NOTE — Patient Instructions (Signed)
 VISIT SUMMARY:  During your visit, we discussed your persistent headaches that have been ongoing for over a year. These headaches began after you changed your birth control and have been occurring daily, often waking you up or appearing throughout the day. We also reviewed your current medications and symptoms, including light sensitivity and occasional lightheadedness.  YOUR PLAN:  -CHRONIC DAILY HEADACHE: Chronic daily headaches are headaches that occur almost every day for a prolonged period. We will start you on prednisone to interrupt the headache cycle and prescribe Imitrex to take at the onset of a headache. You should avoid using Fioricet and keep track of your headache frequency and severity. If your headaches improve after the steroid treatment, we may consider preventative medication. We will also monitor your blood pressure closely.  -VISION STRAIN: Vision strain can contribute to headaches, especially if you have not had a recent eye examination. We recommend scheduling an eye exam to determine if vision correction is needed.  -LOW BLOOD PRESSURE: Low blood pressure can limit the use of certain headache medications. We will monitor your blood pressure closely and avoid medications that could lower it further.  INSTRUCTIONS:  Please schedule an eye examination to check for vision strain. Monitor your headache frequency and severity, and avoid using Fioricet. Follow the prescribed medication regimen: take prednisone as directed for 7 days and use Imitrex at the onset of a headache, not exceeding two doses per day. We will monitor your blood pressure closely. Follow up with us  if your headaches do not improve or if you experience any new symptoms.

## 2023-10-22 NOTE — Progress Notes (Signed)
 Established patient visit   Patient: Melinda Barnett   DOB: Jul 26, 1995   28 y.o. Female  MRN: [id number] Visit Date: 10/22/2023  Today's healthcare provider: Mimi Alt, MD   Chief Complaint  Patient presents with   Headache    Off and on headaches for a year the medication she was taking was helping but not anymore    Subjective     HPI     Headache    Additional comments: Off and on headaches for a year the medication she was taking was helping but not anymore       Last edited by Bart Lieu, CMA on 10/22/2023  1:36 PM.       Discussed the use of AI scribe software for clinical note transcription with the patient, who gave verbal consent to proceed.  History of Present Illness Melinda Barnett is a 28 year old female who presents with persistent headaches for over a year.  The headaches began after she changed her birth control in January of the previous year. Initially, her OB GYN suggested the headaches might be due to hormonal changes from the birth control, and she was advised to take Tylenol . However, the headaches persisted, occurring daily and often waking her up or appearing throughout the day.  The headaches start at the temples and sometimes radiate to the back of her head, with a throbbing sensation described as 'my head is going to explode'. They occur randomly, sometimes upon waking, and can last throughout the day. She has been using Fioricet, taking it approximately twice a week, but reports it is no longer effective. Resting in a dark room helps reduce the intensity but does not completely alleviate them.  No nausea, visual disturbances, or aura are associated with the headaches, but she notes feeling lightheaded when the headaches are severe. She is sensitive to light during headaches, which is why she prefers to rest in a dark environment. Despite the headaches, she continues to work but feels exhausted by the end of the day.  Her current  medications include Fioricet for headaches and an albuterol  inhaler. She previously used Tylenol  and ibuprofen  frequently but was advised to reduce their use due to potential overuse. She is not currently taking Wellbutrin  or Buspar .  She denies alcohol or cigarette use.  Reports that she has some trouble seeing far away and has not seen an eye specialist for several years. She wore glasses when she was in Middle school.      Past Medical History:  Diagnosis Date   Anemia affecting pregnancy in second trimester, antepartum    Asthma    Headache 28 years old   Mood disorder (HCC) 04/17/2023    Medications: Outpatient Medications Prior to Visit  Medication Sig   albuterol  (VENTOLIN  HFA) 108 (90 Base) MCG/ACT inhaler Inhale 2 puffs into the lungs every 6 (six) hours as needed for wheezing or shortness of breath.   butalbital -acetaminophen -caffeine  (FIORICET) 50-325-40 MG tablet Take 1 tablet by mouth every 6 (six) hours as needed for headache.   [DISCONTINUED] buPROPion  (WELLBUTRIN  XL) 150 MG 24 hr tablet Take 1 tablet (150 mg total) by mouth daily.   [DISCONTINUED] busPIRone  (BUSPAR ) 5 MG tablet Take 1 tablet (5 mg total) by mouth 3 (three) times daily as needed.   No facility-administered medications prior to visit.    Review of Systems  Last CBC Lab Results  Component Value Date   WBC 5.1 04/17/2023   HGB 13.9 04/17/2023  HCT 43.1 04/17/2023   MCV 98 (H) 04/17/2023   MCH 31.7 04/17/2023   RDW 12.2 04/17/2023   PLT 256 04/17/2023   Last metabolic panel Lab Results  Component Value Date   GLUCOSE 80 04/17/2023   NA 140 04/17/2023   K 4.0 04/17/2023   CL 105 04/17/2023   CO2 21 04/17/2023   BUN 7 04/17/2023   CREATININE 0.76 04/17/2023   EGFR 109 04/17/2023   CALCIUM 9.4 04/17/2023   PHOS 2.3 (L) 12/01/2014   PROT 6.6 04/17/2023   ALBUMIN 4.5 04/17/2023   LABGLOB 2.1 04/17/2023   BILITOT 0.4 04/17/2023   ALKPHOS 67 04/17/2023   AST 15 04/17/2023   ALT 15  04/17/2023   ANIONGAP 10 01/27/2021   Last lipids Lab Results  Component Value Date   CHOL 166 04/17/2023   HDL 57 04/17/2023   LDLCALC 99 04/17/2023   TRIG 47 04/17/2023   CHOLHDL 2.9 04/17/2023   Last hemoglobin A1c Lab Results  Component Value Date   HGBA1C 5.5 04/17/2023   Last thyroid functions Lab Results  Component Value Date   TSH 0.397 (L) 04/17/2023        Objective    BP 94/64   Pulse 62   Ht 5\' 2"  (1.575 m)   Wt 183 lb (83 kg)   SpO2 100%   BMI 33.47 kg/m  BP Readings from Last 3 Encounters:  10/22/23 94/64  04/17/23 110/75  01/27/21 101/66   Wt Readings from Last 3 Encounters:  10/22/23 183 lb (83 kg)  04/17/23 173 lb (78.5 kg)  01/27/21 169 lb 15.6 oz (77.1 kg)        Physical Exam Vitals reviewed.  Neurological:     Mental Status: She is alert and oriented to person, place, and time.     Cranial Nerves: Cranial nerves 2-12 are intact. No cranial nerve deficit, dysarthria or facial asymmetry.     Sensory: Sensation is intact.     Motor: Motor function is intact. No weakness or tremor.     Coordination: Coordination is intact.     Gait: Gait is intact.     Comments: 5/5 strength in bilateral upper extremities       No results found for any visits on 10/22/23.  Assessment & Plan     Problem List Items Addressed This Visit       Other   Daily headache - Primary   Chronic Daily Headache Experiencing daily throbbing headaches for over a year, initiated after a change in birth control. Headaches start at the temples, sometimes moving to the back of the head, lasting all day, with light sensitivity and occasional lightheadedness. No nausea, aura, or significant visual disturbances. Fioricet use twice a week is no longer effective. Headaches may be tension-type or cluster, not typical migraines. Low blood pressure limits certain preventative medications. - Prescribe prednisone  20 mg for 4 days, then 10 mg for 3 days to interrupt the  headache cycle. - Prescribe Imitrex  (sumatriptan ) 25 mg at headache onset, with a second dose allowed 2 hours later if needed, not exceeding two doses per day. - Avoid Fioricet and monitor headache frequency and severity. - Consider preventative medication if headaches improve after steroid taper. - Monitor blood pressure due to its current low level.      Relevant Medications   SUMAtriptan  (IMITREX ) 25 MG tablet   predniSONE  (DELTASONE ) 20 MG tablet     Assessment & Plan  Vision Strain Potential contribution to headaches from  vision strain. She has not had a recent eye examination, which could be a factor in headache etiology. - Recommend an eye examination to assess for vision strain or correction needs.  Low Blood Pressure Blood pressure is low, atypical for her, without dizziness or lightheadedness. Limits use of certain headache preventative medications. - Monitor blood pressure closely. - Avoid medications that could further lower blood pressure.     Return in about 1 month (around 11/21/2023) for headache .         Mimi Alt, MD  [facility] [phone] (phone) [phone] (fax)  [facility]

## 2023-10-23 NOTE — Assessment & Plan Note (Signed)
 Chronic Daily Headache Experiencing daily throbbing headaches for over a year, initiated after a change in birth control. Headaches start at the temples, sometimes moving to the back of the head, lasting all day, with light sensitivity and occasional lightheadedness. No nausea, aura, or significant visual disturbances. Fioricet use twice a week is no longer effective. Headaches may be tension-type or cluster, not typical migraines. Low blood pressure limits certain preventative medications. - Prescribe prednisone  20 mg for 4 days, then 10 mg for 3 days to interrupt the headache cycle. - Prescribe Imitrex  (sumatriptan ) 25 mg at headache onset, with a second dose allowed 2 hours later if needed, not exceeding two doses per day. - Avoid Fioricet and monitor headache frequency and severity. - Consider preventative medication if headaches improve after steroid taper. - Monitor blood pressure due to its current low level.

## 2023-11-16 DIAGNOSIS — Z419 Encounter for procedure for purposes other than remedying health state, unspecified: Secondary | ICD-10-CM | POA: Diagnosis not present

## 2023-11-24 ENCOUNTER — Ambulatory Visit: Admitting: Family Medicine

## 2023-12-16 ENCOUNTER — Ambulatory Visit
Admission: EM | Admit: 2023-12-16 | Discharge: 2023-12-16 | Disposition: A | Discharge status: Home / Self Care | Attending: Emergency Medicine | Admitting: Emergency Medicine

## 2023-12-16 ENCOUNTER — Encounter: Payer: Self-pay | Admitting: Emergency Medicine

## 2023-12-16 DIAGNOSIS — M545 Low back pain, unspecified: Secondary | ICD-10-CM

## 2023-12-16 MED ORDER — IBUPROFEN 800 MG PO TABS: 800.0000 mg | ORAL_TABLET | Freq: Three times a day (TID) | ORAL | 0 refills | Status: AC

## 2023-12-16 MED ORDER — KETOROLAC TROMETHAMINE 30 MG/ML IJ SOLN
30.0000 mg | Freq: Once | INTRAMUSCULAR | Status: AC
  Administered 2023-12-16: 30 mg via INTRAMUSCULAR

## 2023-12-16 MED ORDER — CYCLOBENZAPRINE HCL 10 MG PO TABS: 10.0000 mg | ORAL_TABLET | Freq: Every day | ORAL | 0 refills | Status: AC

## 2023-12-16 NOTE — ED Triage Notes (Signed)
 Patient complains of lower back pain x 1 week. Patient reports pain increased on today. Rate pain 8/10. Patient has not taken anything for pain.

## 2023-12-16 NOTE — ED Provider Notes (Signed)
 Melinda Barnett    CSN: [id number] Arrival date & time: 12/16/23  1253      History   Chief Complaint Chief Complaint  Patient presents with   Back Pain    HPI Melinda Barnett is a 28 y.o. female.   Patient presents for evaluation of intermittent midline low back pain radiating into the bilateral lower buttocks intermittently present for 7 days.  Symptoms exacerbated by periods of standing.  Endorses symptoms worsened today while bending over at work she was unable to lift herself back up requiring assistance.  Denies numbness or tingling, injury or trauma, urinary or bowel incontinence.  Has not attempted treatment of symptoms.  Past Medical History:  Diagnosis Date   Anemia affecting pregnancy in second trimester, antepartum    Asthma    Headache 28 years old   Mood disorder (HCC) 04/17/2023    Patient Active Problem List   Diagnosis Date Noted   BMI 31.0-31.9,adult 04/17/2023   Family history of diabetes mellitus 04/17/2023   Mild intermittent asthma without complication 04/17/2023   Fatigue 04/17/2023   Daily headache 04/17/2023   Mood disorder (HCC) 04/17/2023   Annual physical exam 04/17/2023    Past Surgical History:  Procedure Laterality Date   NO PAST SURGERIES      OB History     Gravida  3   Para  0   Term  0   Preterm  0   AB  1   Living         SAB  1   IAB  0   Ectopic  0   Multiple      Live Births               Home Medications    Prior to Admission medications   Medication Sig Start Date End Date Taking? Authorizing Provider  cyclobenzaprine  (FLEXERIL ) 10 MG tablet Take 1 tablet (10 mg total) by mouth at bedtime. 12/16/23  Yes Rim Thatch R, NP  ibuprofen  (ADVIL ) 800 MG tablet Take 1 tablet (800 mg total) by mouth 3 (three) times daily. 12/16/23  Yes Eduin Friedel, Maybelle Spatz, NP  albuterol  (VENTOLIN  HFA) 108 (90 Base) MCG/ACT inhaler Inhale 2 puffs into the lungs every 6 (six) hours as needed for wheezing or shortness of  breath. 04/17/23   Normie Becton, FNP  butalbital -acetaminophen -caffeine  (FIORICET) 50-325-40 MG tablet Take 1 tablet by mouth every 6 (six) hours as needed for headache. 04/17/23   Normie Becton, FNP  SUMAtriptan  (IMITREX ) 25 MG tablet Take 1 tablet (25 mg total) by mouth every 2 (two) hours as needed for migraine. May repeat in 2 hours if headache persists or recurs. 10/22/23   Simmons-Robinson, Judyann Number, MD    Family History Family History  Problem Relation Age of Onset   Heart disease Father    Stroke Father    Diabetes Father     Social History Social History   Tobacco Use   Smoking status: Never   Smokeless tobacco: Never  Vaping Use   Vaping status: Never Used  Substance Use Topics   Alcohol use: Yes    Alcohol/week: 1.0 standard drink of alcohol    Types: 1 Glasses of wine per week   Drug use: Yes    Types: Marijuana     Allergies   Patient has no known allergies.   Review of Systems Review of Systems  Musculoskeletal:  Positive for back pain.     Physical Exam Triage Vital Signs ED  Triage Vitals  Encounter Vitals Group     BP 12/16/23 1309 97/63     Systolic BP Percentile --      Diastolic BP Percentile --      Pulse Rate 12/16/23 1309 64     Resp 12/16/23 1309 18     Temp 12/16/23 1309 99 F (37.2 C)     Temp Source 12/16/23 1309 Oral     SpO2 12/16/23 1309 99 %     Weight --      Height --      Head Circumference --      Peak Flow --      Pain Score 12/16/23 1306 8     Pain Loc --      Pain Education --      Exclude from Growth Chart --    No data found.  Updated Vital Signs BP 97/63 (BP Location: Left Arm)   Pulse 64   Temp 99 F (37.2 C) (Oral)   Resp 18   SpO2 99%   Visual Acuity Right Eye Distance:   Left Eye Distance:   Bilateral Distance:    Right Eye Near:   Left Eye Near:    Bilateral Near:     Physical Exam Constitutional:      Appearance: Normal appearance.  Eyes:     Extraocular Movements: Extraocular movements  intact.  Pulmonary:     Effort: Pulmonary effort is normal.  Musculoskeletal:     Comments: Tenderness present to the mid of the lumbar region without point tenderness or spinal tenderness, negative straight leg test, able to sit erect without complication, pain elicited with twisting turning and bending  Neurological:     Mental Status: She is alert and oriented to person, place, and time. Mental status is at baseline.      UC Treatments / Results  Labs (all labs ordered are listed, but only abnormal results are displayed) Labs Reviewed - No data to display  EKG   Radiology No results found.  Procedures Procedures (including critical care time)  Medications Ordered in UC Medications  ketorolac  (TORADOL ) 30 MG/ML injection 30 mg (has no administration in time range)    Initial Impression / Assessment and Plan / UC Course  I have reviewed the triage vital signs and the nursing notes.  Pertinent labs & imaging results that were available during my care of the patient were reviewed by me and considered in my medical decision making (see chart for details).  Acute midline low back pain without sciatica  Etiology most likely \\muscular , no signs of spinal anesthesia or spinal tenderness on exam therefore imaging deferred, Toradol  IM given and prescribed ibuprofen  800 mg and Flexeril  for home use recommended supportive care and advised follow-up if symptoms continue to persist, walker referral given orthopedics Final Clinical Impressions(s) / UC Diagnoses   Final diagnoses:  Acute midline low back pain without sciatica   Discharge Instructions      Your pain is most likely caused by irritation to the muscles .  You have been given an injection of Toradol  to help reduce inflammation and pain, daily will see improvement within an hour  Starting tomorrow take ibuprofen  800 mg every 8 hours, may take Tylenol  or any topical medicines additionally  May use muscle relaxant at  bedtime for additional comfort  You may use heating pad in 15 minute intervals as needed for additional comfort or  you may find comfort in using ice in 10-15 minutes over  affected area  Begin stretching affected area daily for 10 minutes as tolerated to further loosen muscles   When lying down place pillow underneath and between knees for support  Can try sleeping without pillow on firm mattress   Practice good posture: head back, shoulders back, chest forward, pelvis back and weight distributed evenly on both legs  If pain persist after recommended treatment or reoccurs if may be beneficial to follow up with orthopedic specialist for evaluation, this doctor specializes in the bones and can manage your symptoms long-term with options such as but not limited to imaging, medications or physical therapy     ED Prescriptions     Medication Sig Dispense Auth. Provider   ibuprofen  (ADVIL ) 800 MG tablet Take 1 tablet (800 mg total) by mouth 3 (three) times daily. 21 tablet Devonta Blanford R, NP   cyclobenzaprine  (FLEXERIL ) 10 MG tablet Take 1 tablet (10 mg total) by mouth at bedtime. 10 tablet Heliodoro Domagalski R, NP      PDMP not reviewed this encounter.   Reena Canning, NP 12/16/23 1329

## 2023-12-16 NOTE — Discharge Instructions (Signed)
 Your pain is most likely caused by irritation to the muscles .  You have been given an injection of Toradol  to help reduce inflammation and pain, daily will see improvement within an hour  Starting tomorrow take ibuprofen  800 mg every 8 hours, may take Tylenol  or any topical medicines additionally  May use muscle relaxant at bedtime for additional comfort  You may use heating pad in 15 minute intervals as needed for additional comfort or  you may find comfort in using ice in 10-15 minutes over affected area  Begin stretching affected area daily for 10 minutes as tolerated to further loosen muscles   When lying down place pillow underneath and between knees for support  Can try sleeping without pillow on firm mattress   Practice good posture: head back, shoulders back, chest forward, pelvis back and weight distributed evenly on both legs  If pain persist after recommended treatment or reoccurs if may be beneficial to follow up with orthopedic specialist for evaluation, this doctor specializes in the bones and can manage your symptoms long-term with options such as but not limited to imaging, medications or physical therapy

## 2023-12-17 DIAGNOSIS — Z419 Encounter for procedure for purposes other than remedying health state, unspecified: Secondary | ICD-10-CM | POA: Diagnosis not present

## 2024-01-16 DIAGNOSIS — Z419 Encounter for procedure for purposes other than remedying health state, unspecified: Secondary | ICD-10-CM | POA: Diagnosis not present

## 2024-02-16 DIAGNOSIS — Z419 Encounter for procedure for purposes other than remedying health state, unspecified: Secondary | ICD-10-CM | POA: Diagnosis not present

## 2024-02-22 ENCOUNTER — Emergency Department
Admission: EM | Admit: 2024-02-22 | Discharge: 2024-02-22 | Disposition: A | Discharge status: Home / Self Care | Attending: Emergency Medicine | Admitting: Emergency Medicine

## 2024-02-22 ENCOUNTER — Other Ambulatory Visit: Payer: Self-pay

## 2024-02-22 DIAGNOSIS — N611 Abscess of the breast and nipple: Secondary | ICD-10-CM | POA: Insufficient documentation

## 2024-02-22 DIAGNOSIS — Z5189 Encounter for other specified aftercare: Secondary | ICD-10-CM

## 2024-02-22 DIAGNOSIS — L0291 Cutaneous abscess, unspecified: Secondary | ICD-10-CM

## 2024-02-22 DIAGNOSIS — Z4800 Encounter for change or removal of nonsurgical wound dressing: Secondary | ICD-10-CM | POA: Insufficient documentation

## 2024-02-22 DIAGNOSIS — Z4801 Encounter for change or removal of surgical wound dressing: Secondary | ICD-10-CM | POA: Diagnosis not present

## 2024-02-22 MED ORDER — DOXYCYCLINE HYCLATE 100 MG PO TABS: 100.0000 mg | ORAL_TABLET | Freq: Two times a day (BID) | ORAL | 0 refills | Status: AC

## 2024-02-22 NOTE — ED Provider Notes (Signed)
   [facility] Provider Note    Event Date/Time   First MD Initiated Contact with Patient 02/22/24 1211     (approximate)   History   Wound Check   HPI  Melinda Barnett is a 28 y.o. female who reports about 2 months ago she had a small pimple on her proximal right breast which she squeezed but it turned into a bit of a firm area underneath the skin.  The last day or so it has been draining small amount of blood and pus     Physical Exam   Triage Vital Signs: ED Triage Vitals  Encounter Vitals Group     BP 02/22/24 1143 122/71     Girls Systolic BP Percentile --      Girls Diastolic BP Percentile --      Boys Systolic BP Percentile --      Boys Diastolic BP Percentile --      Pulse Rate 02/22/24 1143 (!) 55     Resp 02/22/24 1143 17     Temp 02/22/24 1143 98.2 F (36.8 C)     Temp Source 02/22/24 1143 Oral     SpO2 02/22/24 1143 100 %     Weight 02/22/24 1144 83.9 kg (185 lb)     Height 02/22/24 1144 1.549 m (5' 1)     Head Circumference --      Peak Flow --      Pain Score 02/22/24 1144 8     Pain Loc --      Pain Education --      Exclude from Growth Chart --     Most recent vital signs: Vitals:   02/22/24 1143 02/22/24 1225  BP: 122/71 122/76  Pulse: (!) 55 62  Resp: 17 17  Temp: 98.2 F (36.8 C) 98.1 F (36.7 C)  SpO2: 100% 100%     General: Awake, no distress.  CV:  Good peripheral perfusion.  Resp:  Normal effort.  Abd:  No distention.  Other:  Small purulent discharge from likely subcutaneous abscess, no surrounding erythema   ED Results / Procedures / Treatments   Labs (all labs ordered are listed, but only abnormal results are displayed) Labs Reviewed - No data to display   EKG     RADIOLOGY     PROCEDURES:  Critical Care performed:   Procedures   MEDICATIONS ORDERED IN ED: Medications - No data to display   IMPRESSION / MDM / ASSESSMENT AND PLAN / ED COURSE  I reviewed the triage vital signs and  the nursing notes. Patient's presentation is most consistent with acute, uncomplicated illness.  Consistent with draining abscess, will start the patient on doxycycline , follow-up with general surgery in case I&D is necessary.  No I&D necessary at this time        FINAL CLINICAL IMPRESSION(S) / ED DIAGNOSES   Final diagnoses:  Visit for wound check  Abscess     Rx / DC Orders   ED Discharge Orders          Ordered    doxycycline  (VIBRA -TABS) 100 MG tablet  2 times daily        02/22/24 1217             Note:  This document was prepared using Dragon voice recognition software and may include unintentional dictation errors.   Arlander Charleston, MD 02/22/24 [phone]

## 2024-02-22 NOTE — ED Triage Notes (Signed)
 Pt sts that she has a spot on her chest in between her breasts for the last two months. Pt sts that she woke up this AM and the spot is now draining fluid.

## 2024-03-03 DIAGNOSIS — H5213 Myopia, bilateral: Secondary | ICD-10-CM | POA: Diagnosis not present

## 2024-03-18 DIAGNOSIS — Z419 Encounter for procedure for purposes other than remedying health state, unspecified: Secondary | ICD-10-CM | POA: Diagnosis not present

## 2024-04-05 ENCOUNTER — Emergency Department: Admission: EM | Admit: 2024-04-05 | Discharge: 2024-04-05 | Disposition: A | Discharge status: Home / Self Care

## 2024-04-05 ENCOUNTER — Other Ambulatory Visit: Payer: Self-pay

## 2024-04-05 DIAGNOSIS — J45909 Unspecified asthma, uncomplicated: Secondary | ICD-10-CM | POA: Diagnosis not present

## 2024-04-05 DIAGNOSIS — M79671 Pain in right foot: Secondary | ICD-10-CM | POA: Diagnosis present

## 2024-04-05 DIAGNOSIS — M7751 Other enthesopathy of right foot: Secondary | ICD-10-CM | POA: Diagnosis not present

## 2024-04-05 DIAGNOSIS — M7661 Achilles tendinitis, right leg: Secondary | ICD-10-CM | POA: Diagnosis not present

## 2024-04-05 DIAGNOSIS — M779 Enthesopathy, unspecified: Secondary | ICD-10-CM | POA: Diagnosis not present

## 2024-04-05 NOTE — ED Triage Notes (Signed)
 Pt comes in via pov with complaints of right foot pain that started back in August. PT was seen at the doctor and was placed in a boot with tendonitis. Pt took the book off yesterday, and returned to work today, and is experiencing the same pain that she was before. Pt complains of pain 8/10 at this time. Pt hasn't taken anything for pain.

## 2024-04-05 NOTE — Discharge Instructions (Signed)
 Please wear the walking boot anytime you are up walking around.  Please take the meloxicam  (Mobic ) once a day for 2 weeks.  This is an anti-inflammatory.  Do not take other NSAIDs while taking this medication.  NSAIDs include ibuprofen , Motrin , Advil , naproxen , Aleve , celecoxib, and Celebrex.  You can take 650 mg of Tylenol  every 6 hours as needed for pain. You can use ice, heat, muscle creams and other topical pain relievers as well.  Please follow podiatry whose information is attached.

## 2024-04-05 NOTE — ED Provider Notes (Signed)
 [facility] Provider Note    Event Date/Time   First MD Initiated Contact with Patient 04/05/24 1330     (approximate)   History   Foot Pain   HPI  Melinda Barnett is a 28 y.o. female with PMH of asthma who presents for evaluation of right foot pain.  Patient states she has had the pain since August.  She has been evaluated for this previously and was placed in a postop shoe.  She had negative x-rays.  She was treated for tendinitis.  She returned to work today and the same pain she was experiencing before came back.  She has not taken any medication for pain at this time.      Physical Exam   Triage Vital Signs: ED Triage Vitals  Encounter Vitals Group     BP 04/05/24 1242 131/85     Girls Systolic BP Percentile --      Girls Diastolic BP Percentile --      Boys Systolic BP Percentile --      Boys Diastolic BP Percentile --      Pulse Rate 04/05/24 1242 60     Resp 04/05/24 1242 18     Temp 04/05/24 1242 98.7 F (37.1 C)     Temp src --      SpO2 04/05/24 1242 96 %     Weight 04/05/24 1243 195 lb (88.5 kg)     Height 04/05/24 1243 5' 1 (1.549 m)     Head Circumference --      Peak Flow --      Pain Score 04/05/24 1243 8     Pain Loc --      Pain Education --      Exclude from Growth Chart --     Most recent vital signs: Vitals:   04/05/24 1242  BP: 131/85  Pulse: 60  Resp: 18  Temp: 98.7 F (37.1 C)  SpO2: 96%   General: Awake, no distress.  CV:  Good peripheral perfusion.  Resp:  Normal effort.  Abd:  No distention.  Other:  Right foot tender to palpation over the 1st metatarsal, arch of the foot and dorsum of the foot, foot and ankle ROM well maintained, dorsalis pedis pulse is 2+ and regular   ED Results / Procedures / Treatments   Labs (all labs ordered are listed, but only abnormal results are displayed) Labs Reviewed - No data to display     PROCEDURES:  Critical Care performed: No  Procedures   MEDICATIONS  ORDERED IN ED: Medications - No data to display   IMPRESSION / MDM / ASSESSMENT AND PLAN / ED COURSE  I reviewed the triage vital signs and the nursing notes.                             28 year old female presents for evaluation of right foot pain.  Vital signs are stable patient NAD on exam.  Differential diagnosis includes, but is not limited to, plantar fasciitis, tendinitis, fracture, sprain.  Patient's presentation is most consistent with acute, uncomplicated illness.  Patient reports that this pain is exactly the same as a previous pain she had.  There has been no new injuries.  She just returned to work today.  Do not feel that x-rays are needed and she has not had any new falls or trauma.  Suspect that this is ongoing tendinitis.  Does sound like patient was  previously wearing a postop shoe or I feel she would get better benefit from a walking boot.  Will place patient in a walking boot today.  She did feel like she had good relief with the meloxicam  so we will do another round of this.  Will advise her to follow-up with podiatry.  Patient was given a note for work.  She voiced understanding, all questions were answered and she was stable at discharge.      FINAL CLINICAL IMPRESSION(S) / ED DIAGNOSES   Final diagnoses:  Tendinitis of right foot     Rx / DC Orders   ED Discharge Orders     None        Note:  This document was prepared using Dragon voice recognition software and may include unintentional dictation errors.   Cleaster Tinnie LABOR, PA-C 04/05/24 1501    Clarine Ozell LABOR, MD 04/05/24 [phone]

## 2024-04-12 ENCOUNTER — Telehealth: Payer: Self-pay | Admitting: Podiatry

## 2024-04-12 ENCOUNTER — Encounter: Payer: Self-pay | Admitting: Podiatry

## 2024-04-12 ENCOUNTER — Ambulatory Visit (INDEPENDENT_AMBULATORY_CARE_PROVIDER_SITE_OTHER): Admitting: Podiatry

## 2024-04-12 DIAGNOSIS — M722 Plantar fascial fibromatosis: Secondary | ICD-10-CM

## 2024-04-12 MED ORDER — MELOXICAM 15 MG PO TABS: 15.0000 mg | ORAL_TABLET | Freq: Every day | ORAL | 0 refills | Status: AC

## 2024-04-12 MED ORDER — METHYLPREDNISOLONE 4 MG PO TBPK: ORAL_TABLET | ORAL | 0 refills | Status: AC

## 2024-04-12 NOTE — Progress Notes (Signed)
 Subjective:  Patient ID: Melinda Barnett, female    DOB: Aug 28, 1995,  MRN: [id number]  Chief Complaint  Patient presents with   Foot Pain    Right foot pain pt stated that she was told she has tendonitis and was given a boot to help with the pain     28 y.o. female presents with the above complaint.  Patient presents with right midfoot plantar fasciitis hurts with ambulation is with pressure has not seen MRIs prior to seeing me.  She would like to discuss treatment options for this.  Pain scale 7 out of 10 dull aching nature.  She states it just came out of nowhere.  She went to urgent care they gave her a boot.  The boot has not helped.   Review of Systems: Negative except as noted in the HPI. Denies N/V/F/Ch.  Past Medical History:  Diagnosis Date   Anemia affecting pregnancy in second trimester, antepartum    Asthma    Headache 28 years old   Mood disorder 04/17/2023    Current Outpatient Medications:    meloxicam  (MOBIC ) 15 MG tablet, Take 1 tablet (15 mg total) by mouth daily., Disp: 30 tablet, Rfl: 0   methylPREDNISolone (MEDROL DOSEPAK) 4 MG TBPK tablet, Take as directed, Disp: 21 each, Rfl: 0   albuterol  (VENTOLIN  HFA) 108 (90 Base) MCG/ACT inhaler, Inhale 2 puffs into the lungs every 6 (six) hours as needed for wheezing or shortness of breath., Disp: 18 g, Rfl: 11   butalbital -acetaminophen -caffeine  (FIORICET) 50-325-40 MG tablet, Take 1 tablet by mouth every 6 (six) hours as needed for headache., Disp: 14 tablet, Rfl: 1   cyclobenzaprine  (FLEXERIL ) 10 MG tablet, Take 1 tablet (10 mg total) by mouth at bedtime., Disp: 10 tablet, Rfl: 0   doxycycline  (VIBRA -TABS) 100 MG tablet, Take 1 tablet (100 mg total) by mouth 2 (two) times daily., Disp: 14 tablet, Rfl: 0   ibuprofen  (ADVIL ) 800 MG tablet, Take 1 tablet (800 mg total) by mouth 3 (three) times daily., Disp: 21 tablet, Rfl: 0   SUMAtriptan  (IMITREX ) 25 MG tablet, Take 1 tablet (25 mg total) by mouth every 2 (two) hours as needed  for migraine. May repeat in 2 hours if headache persists or recurs., Disp: 10 tablet, Rfl: 0  Social History   Tobacco Use  Smoking Status Never  Smokeless Tobacco Never    No Known Allergies Objective:  There were no vitals filed for this visit. There is no height or weight on file to calculate BMI. Constitutional Well developed. Well nourished.  Vascular Dorsalis pedis pulses palpable bilaterally. Posterior tibial pulses palpable bilaterally. Capillary refill normal to all digits.  No cyanosis or clubbing noted. Pedal hair growth normal.  Neurologic Normal speech. Oriented to person, place, and time. Epicritic sensation to light touch grossly present bilaterally.  Dermatologic Nails well groomed and normal in appearance. No open wounds. No skin lesions.  Orthopedic: Normal joint ROM without pain or crepitus bilaterally. No visible deformities. Tender to palpation at the midfoot right side No pain with calcaneal squeeze right. Ankle ROM diminished range of motion right. Silfverskiold Test: positive right.   Radiographs: None  Assessment:   1. Plantar fasciitis of right foot    Plan:  Patient was evaluated and treated and all questions answered.  Plantar Fasciitis, right midfoot - XR reviewed as above.  - Educated on icing and stretching. Instructions given.  - Injection delivered to the plantar fascia as below. - DME: Plantar fascial brace dispensed to support  the medial longitudinal arch of the foot and offload pressure from the heel and prevent arch collapse during weightbearing - Pharmacologic management: None  Procedure: Injection Tendon/Ligament Location: Right plantar fascia at the midfoot; medial approach. Skin Prep: alcohol Injectate: 0.5 cc 0.5% marcaine  plain, 0.5 cc of 1% Lidocaine , 0.5 cc kenalog 10. Disposition: Patient tolerated procedure well. Injection site dressed with a band-aid.  No follow-ups on file.

## 2024-04-12 NOTE — Telephone Encounter (Signed)
 mess from pt she needs detailed light duty note. I cld and adv would redo note sitting duty only until approx 04/30/24. Her next appt is 04/26/24. vrf email and will send to her via email

## 2024-04-17 DIAGNOSIS — Z419 Encounter for procedure for purposes other than remedying health state, unspecified: Secondary | ICD-10-CM | POA: Diagnosis not present

## 2024-04-26 ENCOUNTER — Ambulatory Visit: Admitting: Podiatry

## 2024-05-18 DIAGNOSIS — Z419 Encounter for procedure for purposes other than remedying health state, unspecified: Secondary | ICD-10-CM | POA: Diagnosis not present

## 2024-06-22 ENCOUNTER — Encounter: Payer: Self-pay | Admitting: Family Medicine

## 2024-06-22 ENCOUNTER — Other Ambulatory Visit: Payer: Self-pay

## 2024-06-22 NOTE — Telephone Encounter (Signed)
 LOV  10/22/2023 NOV LRF  12/16/2023  21 TABS X 0
